# Patient Record
Sex: Male | Born: 1940 | Race: Black or African American | Hispanic: No | Smoking: Never smoker
Health system: Southern US, Community
[De-identification: ages and names within clinical notes are randomized; demographics above are authoritative.]

## PROBLEM LIST (undated history)

## (undated) DIAGNOSIS — I10 Essential (primary) hypertension: Secondary | ICD-10-CM

## (undated) DIAGNOSIS — C61 Malignant neoplasm of prostate: Secondary | ICD-10-CM

## (undated) DIAGNOSIS — F0280 Dementia in other diseases classified elsewhere without behavioral disturbance: Secondary | ICD-10-CM

## (undated) DIAGNOSIS — I495 Sick sinus syndrome: Secondary | ICD-10-CM

## (undated) DIAGNOSIS — I4891 Unspecified atrial fibrillation: Secondary | ICD-10-CM

## (undated) DIAGNOSIS — I351 Nonrheumatic aortic (valve) insufficiency: Secondary | ICD-10-CM

## (undated) DIAGNOSIS — K220 Achalasia of cardia: Secondary | ICD-10-CM

## (undated) DIAGNOSIS — N289 Disorder of kidney and ureter, unspecified: Secondary | ICD-10-CM

## (undated) HISTORY — PX: PACEMAKER IMPLANT: EP1218

## (undated) HISTORY — DX: Essential (primary) hypertension: I10

## (undated) HISTORY — PX: CHOLECYSTECTOMY: SHX55

## (undated) HISTORY — DX: Unspecified atrial fibrillation: I48.91

## (undated) HISTORY — DX: Nonrheumatic aortic (valve) insufficiency: I35.1

## (undated) HISTORY — DX: Dementia in other diseases classified elsewhere, unspecified severity, without behavioral disturbance, psychotic disturbance, mood disturbance, and anxiety: F02.80

## (undated) HISTORY — DX: Achalasia of cardia: K22.0

## (undated) HISTORY — DX: Malignant neoplasm of prostate: C61

## (undated) HISTORY — DX: Sick sinus syndrome: I49.5

---

## 2009-09-03 HISTORY — PX: ESOPHAGOGASTRODUODENOSCOPY: SHX1529

## 2010-09-03 HISTORY — PX: HELLER MYOTOMY: SHX5259

## 2010-09-03 HISTORY — PX: OTHER SURGICAL HISTORY: SHX169

## 2014-09-03 HISTORY — PX: ESOPHAGOGASTRODUODENOSCOPY: SHX1529

## 2019-02-11 ENCOUNTER — Encounter: Payer: Self-pay | Admitting: Internal Medicine

## 2019-04-01 ENCOUNTER — Ambulatory Visit: Payer: Self-pay | Admitting: Gastroenterology

## 2019-04-03 ENCOUNTER — Encounter: Payer: Self-pay | Admitting: *Deleted

## 2019-04-03 ENCOUNTER — Encounter: Payer: Self-pay | Admitting: Gastroenterology

## 2019-04-03 ENCOUNTER — Other Ambulatory Visit: Payer: Self-pay

## 2019-04-03 ENCOUNTER — Ambulatory Visit (INDEPENDENT_AMBULATORY_CARE_PROVIDER_SITE_OTHER): Payer: Medicare HMO | Admitting: Gastroenterology

## 2019-04-03 VITALS — BP 150/95 | HR 87 | Temp 96.2°F | Ht 74.0 in | Wt 177.6 lb

## 2019-04-03 DIAGNOSIS — N189 Chronic kidney disease, unspecified: Secondary | ICD-10-CM

## 2019-04-03 DIAGNOSIS — D631 Anemia in chronic kidney disease: Secondary | ICD-10-CM | POA: Diagnosis not present

## 2019-04-03 DIAGNOSIS — Z87448 Personal history of other diseases of urinary system: Secondary | ICD-10-CM

## 2019-04-03 DIAGNOSIS — K220 Achalasia of cardia: Secondary | ICD-10-CM | POA: Insufficient documentation

## 2019-04-03 NOTE — Patient Instructions (Signed)
We have ordered a swallow test for you so we can evaluate your anatomy.  I have ordered blood work today as well.  We are referring you to Nephrology.  Please follow a soft diet, sit upright while eating, and do not lay down for at least 3 hours after eating.   Further recommendations to follow!  It was a pleasure to see you today. I strive to create trusting relationships with patients to provide genuine, compassionate, and quality care. I value your feedback. If you receive a survey regarding your visit,  I greatly appreciate you taking time to fill this out.   Annitta Needs, PhD, ANP-BC [facility]

## 2019-04-03 NOTE — Progress Notes (Signed)
Primary Care Physician:  Monico Blitz, MD Primary Gastroenterologist:  Dr. Gala Romney   Chief Complaint  Patient presents with  . Dysphagia    HPI:   Curtis Mcintosh is a 78 y.o. male presenting today at the request of Dr. Manuella Mcintosh due to dysphagia. Curtis Mcintosh, daughter, is present. Patient resides with daughter. He has a history of achalasia, s/p heller myotomy with fundoplication at the [facility] in 2012. Last EGD in 2016 at the [facility] with fluid-filled dilated esophagus tight at EG junction, s/p dilation. He noted improvement after that time.   Hard to swallow with eating, drinking water. Gets to throat/mid-esophagus and can't go down. Will regurgitate food as well. Eating a soft diet. Daughter, Curtis Mcintosh, present. Bread will get hung in esophagus. Mashed potatoes go down well. Has to pound chest to get it down. Liquids tolerated. No odynophagia. Feels like the door in esophagus is shut and can't open it. Weight fluctuates. In 2012 was a "big man". Has stayed in the 170s for some time. Daughter has bought boost drinks for patient, carnation instant breakfast. Had been on omeprazole but daughter states nephrologist discontinued PPI in setting of stage 3 CKD at the end of 2019. Pepcid 40 mg BID.   In past has done well with more solid food items such as beef, chicken, sausage. For past year with recurrent dysphagia.   Daughter states history of anemia. Cold all the time. No recent labs.   Past Medical History:  Diagnosis Date  . A-fib ([place])   . Achalasia   . Alzheimer's dementia ([place] )   . Aortic insufficiency   . HTN (hypertension)   . Prostate cancer ([place])   . Sick sinus syndrome [facility])    pacemaker in remote past    Past Surgical History:  Procedure Laterality Date  . CHOLECYSTECTOMY    . ESOPHAGOGASTRODUODENOSCOPY  2011   [facility]: dilated esophagus and tight LES, s/p 25 units Botox, multiple antral polyps s/p biopsy  . ESOPHAGOGASTRODUODENOSCOPY  2016   Last EGD in 2016 at  the [facility] with fluid-filled esophagus with dilatation, s/p dilation. Tight at EG junction. polypoid gastritis in antrum  . HELLER MYOTOMY  2012   [facility]  . PACEMAKER IMPLANT    . pH and manometry  2012   motility compatible with achalasia or complete aperistalsis, or compatible with s/p Heller myotomy, acidity within esophagus.     Current Outpatient Medications  Medication Sig Dispense Refill  . albuterol (PROVENTIL) (2.5 MG/3ML) 0.083% nebulizer solution Inhale 3 mLs into the lungs as needed.    . calcitRIOL (ROCALTROL) 0.5 MCG capsule Take 1 capsule by mouth daily.    Marland Kitchen donepezil (ARICEPT) 5 MG tablet Take 5 mg by mouth at bedtime.    . famotidine (PEPCID) 40 MG tablet Take 1 tablet by mouth 2 (two) times a day.    . lisinopril (ZESTRIL) 20 MG tablet Take 1 tablet by mouth daily.    . meloxicam (MOBIC) 7.5 MG tablet Take 1 tablet by mouth 2 (two) times a day.    . mirtazapine (REMERON) 15 MG tablet Take 1 tablet by mouth at bedtime.    . tamsulosin (FLOMAX) 0.4 MG CAPS capsule Take 1 capsule by mouth daily.     No current facility-administered medications for this visit.     Allergies as of 04/03/2019  . (No Known Allergies)    Family History  Problem Relation Age of Onset  . Colon cancer Neg Hx   .  Colon polyps Neg Hx     Social History   Socioeconomic History  . Marital status: Divorced    Spouse name: Not on file  . Number of children: Not on file  . Years of education: Not on file  . Highest education level: Not on file  Occupational History  . Not on file  Social Needs  . Financial resource strain: Not on file  . Food insecurity    Worry: Not on file    Inability: Not on file  . Transportation needs    Medical: Not on file    Non-medical: Not on file  Tobacco Use  . Smoking status: Never Smoker  . Smokeless tobacco: Never Used  Substance and Sexual Activity  . Alcohol use: Not Currently    Comment: in the past  . Drug use: Never  .  Sexual activity: Not on file  Lifestyle  . Physical activity    Days per week: Not on file    Minutes per session: Not on file  . Stress: Not on file  Relationships  . Social Herbalist on phone: Not on file    Gets together: Not on file    Attends religious service: Not on file    Active member of club or organization: Not on file    Attends meetings of clubs or organizations: Not on file    Relationship status: Not on file  . Intimate partner violence    Fear of current or ex partner: Not on file    Emotionally abused: Not on file    Physically abused: Not on file    Forced sexual activity: Not on file  Other Topics Concern  . Not on file  Social History Narrative  . Not on file    Review of Systems: Gen: see HPI CV: Denies chest pain, heart palpitations, peripheral edema, syncope.  Resp: Denies shortness of breath at rest or with exertion. Denies wheezing or cough.  GI: see HPI GU : Denies urinary burning, urinary frequency, urinary hesitancy MS: Denies joint pain, muscle weakness, cramps, or limitation of movement.  Derm: Denies rash, itching, dry skin Psych: +confusion, history of dementia Heme: Denies bruising, bleeding, and enlarged lymph nodes.  Physical Exam: BP (!) 150/95   Pulse 87   Temp (!) 96.2 F (35.7 C) (Temporal)   Ht 6\' 2"  (1.88 m)   Wt 177 lb 9.6 oz (80.6 kg)   BMI 22.80 kg/m  General:   Alert and oriented. Pleasant and cooperative. Well-nourished and well-developed.  Head:  Normocephalic and atraumatic. Eyes:  Without icterus, sclera clear and conjunctiva pink.  Ears:  Normal auditory acuity. Nose:  No deformity, discharge,  or lesions. Lungs:  Clear to auscultation bilaterally. No wheezes, rales, or rhonchi. No distress.  Heart:  S1, S2 present with irregular. Possibly murmur Abdomen:  +BS, soft, non-tender and non-distended. No HSM noted. No guarding or rebound. No masses appreciated.  Rectal:  Deferred  Extremities:  Without   edema. Neurologic:  Alert and  oriented x4 but limited historian in setting of Alzheimer's dementia  Psych:  Alert and cooperative. Normal mood and affect.

## 2019-04-07 ENCOUNTER — Encounter: Payer: Self-pay | Admitting: Gastroenterology

## 2019-04-07 NOTE — Assessment & Plan Note (Signed)
Per daughter, patient with history of anemia. History of CKD and requesting referral to Nephrology. Will refer to Nephrology and update CBC and iron studies.

## 2019-04-07 NOTE — Assessment & Plan Note (Signed)
78 year old male with history of achalasia, s/p Heller myotomy with fundoplication in 5488 at the [facility]. Last EGD in 2016 in [place] with fluid-filled dilated esophagus, tight at EG junction, s/p dilation, and he noted improvement after this with dysphagia. Now with recurrent solid food dysphagia, regurgitation for the past year. Will pursue UGI and then likely need endoscopic intervention. Further recommendations to follow.

## 2019-04-09 ENCOUNTER — Telehealth: Payer: Self-pay | Admitting: Internal Medicine

## 2019-04-09 ENCOUNTER — Ambulatory Visit (HOSPITAL_COMMUNITY): Payer: Medicare HMO

## 2019-04-09 NOTE — Telephone Encounter (Signed)
Patient daughter called and said they would not be able to get to his appointment today in radiology, I gave her the central scheduling number to get that rescheduled.

## 2019-04-09 NOTE — Progress Notes (Signed)
Cc'd to pcp 

## 2019-04-09 NOTE — Telephone Encounter (Signed)
noted 

## 2019-04-15 ENCOUNTER — Other Ambulatory Visit: Payer: Self-pay

## 2019-04-15 ENCOUNTER — Encounter (HOSPITAL_COMMUNITY): Payer: Self-pay

## 2019-04-15 ENCOUNTER — Telehealth: Payer: Self-pay | Admitting: Gastroenterology

## 2019-04-15 ENCOUNTER — Inpatient Hospital Stay (HOSPITAL_COMMUNITY)
Admission: EM | Admit: 2019-04-15 | Discharge: 2019-04-19 | DRG: 378 | Disposition: A | Discharge status: Home / Self Care | Payer: Medicare HMO | Source: Ambulatory Visit | Attending: Family Medicine | Admitting: Family Medicine

## 2019-04-15 ENCOUNTER — Other Ambulatory Visit (HOSPITAL_COMMUNITY)
Admission: RE | Admit: 2019-04-15 | Discharge: 2019-04-15 | Disposition: A | Discharge status: Home / Self Care | Payer: Medicare HMO | Source: Ambulatory Visit | Attending: Gastroenterology | Admitting: Gastroenterology

## 2019-04-15 ENCOUNTER — Telehealth: Payer: Self-pay

## 2019-04-15 ENCOUNTER — Ambulatory Visit (HOSPITAL_COMMUNITY)
Admission: RE | Admit: 2019-04-15 | Discharge: 2019-04-15 | Disposition: A | Discharge status: Home / Self Care | Payer: Medicare HMO | Source: Ambulatory Visit | Attending: Gastroenterology | Admitting: Gastroenterology

## 2019-04-15 DIAGNOSIS — D631 Anemia in chronic kidney disease: Secondary | ICD-10-CM | POA: Diagnosis present

## 2019-04-15 DIAGNOSIS — R1310 Dysphagia, unspecified: Secondary | ICD-10-CM

## 2019-04-15 DIAGNOSIS — K220 Achalasia of cardia: Secondary | ICD-10-CM | POA: Diagnosis present

## 2019-04-15 DIAGNOSIS — R109 Unspecified abdominal pain: Secondary | ICD-10-CM

## 2019-04-15 DIAGNOSIS — T39395A Adverse effect of other nonsteroidal anti-inflammatory drugs [NSAID], initial encounter: Secondary | ICD-10-CM | POA: Diagnosis present

## 2019-04-15 DIAGNOSIS — Z8546 Personal history of malignant neoplasm of prostate: Secondary | ICD-10-CM

## 2019-04-15 DIAGNOSIS — Z20828 Contact with and (suspected) exposure to other viral communicable diseases: Secondary | ICD-10-CM | POA: Diagnosis present

## 2019-04-15 DIAGNOSIS — R1314 Dysphagia, pharyngoesophageal phase: Secondary | ICD-10-CM | POA: Diagnosis present

## 2019-04-15 DIAGNOSIS — I10 Essential (primary) hypertension: Secondary | ICD-10-CM

## 2019-04-15 DIAGNOSIS — K922 Gastrointestinal hemorrhage, unspecified: Secondary | ICD-10-CM | POA: Diagnosis not present

## 2019-04-15 DIAGNOSIS — F0390 Unspecified dementia without behavioral disturbance: Secondary | ICD-10-CM

## 2019-04-15 DIAGNOSIS — I4891 Unspecified atrial fibrillation: Secondary | ICD-10-CM | POA: Diagnosis present

## 2019-04-15 DIAGNOSIS — D649 Anemia, unspecified: Secondary | ICD-10-CM | POA: Diagnosis not present

## 2019-04-15 DIAGNOSIS — N179 Acute kidney failure, unspecified: Secondary | ICD-10-CM | POA: Diagnosis present

## 2019-04-15 DIAGNOSIS — K921 Melena: Principal | ICD-10-CM | POA: Diagnosis present

## 2019-04-15 DIAGNOSIS — R933 Abnormal findings on diagnostic imaging of other parts of digestive tract: Secondary | ICD-10-CM

## 2019-04-15 DIAGNOSIS — Z791 Long term (current) use of non-steroidal anti-inflammatories (NSAID): Secondary | ICD-10-CM

## 2019-04-15 DIAGNOSIS — G309 Alzheimer's disease, unspecified: Secondary | ICD-10-CM | POA: Diagnosis present

## 2019-04-15 DIAGNOSIS — D500 Iron deficiency anemia secondary to blood loss (chronic): Secondary | ICD-10-CM

## 2019-04-15 DIAGNOSIS — D62 Acute posthemorrhagic anemia: Secondary | ICD-10-CM | POA: Diagnosis present

## 2019-04-15 DIAGNOSIS — Z9049 Acquired absence of other specified parts of digestive tract: Secondary | ICD-10-CM

## 2019-04-15 DIAGNOSIS — K228 Other specified diseases of esophagus: Secondary | ICD-10-CM | POA: Diagnosis present

## 2019-04-15 DIAGNOSIS — E861 Hypovolemia: Secondary | ICD-10-CM | POA: Diagnosis present

## 2019-04-15 DIAGNOSIS — K317 Polyp of stomach and duodenum: Secondary | ICD-10-CM | POA: Diagnosis present

## 2019-04-15 DIAGNOSIS — R1319 Other dysphagia: Secondary | ICD-10-CM

## 2019-04-15 DIAGNOSIS — I129 Hypertensive chronic kidney disease with stage 1 through stage 4 chronic kidney disease, or unspecified chronic kidney disease: Secondary | ICD-10-CM | POA: Diagnosis present

## 2019-04-15 DIAGNOSIS — I495 Sick sinus syndrome: Secondary | ICD-10-CM | POA: Diagnosis present

## 2019-04-15 DIAGNOSIS — I351 Nonrheumatic aortic (valve) insufficiency: Secondary | ICD-10-CM | POA: Diagnosis present

## 2019-04-15 DIAGNOSIS — R296 Repeated falls: Secondary | ICD-10-CM | POA: Diagnosis present

## 2019-04-15 DIAGNOSIS — Y929 Unspecified place or not applicable: Secondary | ICD-10-CM

## 2019-04-15 DIAGNOSIS — Z95810 Presence of automatic (implantable) cardiac defibrillator: Secondary | ICD-10-CM

## 2019-04-15 DIAGNOSIS — N184 Chronic kidney disease, stage 4 (severe): Secondary | ICD-10-CM | POA: Diagnosis present

## 2019-04-15 DIAGNOSIS — F0280 Dementia in other diseases classified elsewhere without behavioral disturbance: Secondary | ICD-10-CM | POA: Diagnosis present

## 2019-04-15 DIAGNOSIS — Z79899 Other long term (current) drug therapy: Secondary | ICD-10-CM

## 2019-04-15 HISTORY — DX: Disorder of kidney and ureter, unspecified: N28.9

## 2019-04-15 LAB — CBC WITH DIFFERENTIAL/PLATELET
Abs Immature Granulocytes: 0 10*3/uL (ref 0.00–0.07)
Basophils Absolute: 0 10*3/uL (ref 0.0–0.1)
Basophils Relative: 0 %
Eosinophils Absolute: 0.2 10*3/uL (ref 0.0–0.5)
Eosinophils Relative: 6 %
HCT: 24.5 % — ABNORMAL LOW (ref 39.0–52.0)
Hemoglobin: 6.9 g/dL — CL (ref 13.0–17.0)
Immature Granulocytes: 0 %
Lymphocytes Relative: 23 %
Lymphs Abs: 0.8 10*3/uL (ref 0.7–4.0)
MCH: 20.4 pg — ABNORMAL LOW (ref 26.0–34.0)
MCHC: 28.2 g/dL — ABNORMAL LOW (ref 30.0–36.0)
MCV: 72.5 fL — ABNORMAL LOW (ref 80.0–100.0)
Monocytes Absolute: 0.4 10*3/uL (ref 0.1–1.0)
Monocytes Relative: 10 %
Neutro Abs: 2.2 10*3/uL (ref 1.7–7.7)
Neutrophils Relative %: 61 %
Platelets: 109 10*3/uL — ABNORMAL LOW (ref 150–400)
RBC: 3.38 MIL/uL — ABNORMAL LOW (ref 4.22–5.81)
RDW: 20.1 % — ABNORMAL HIGH (ref 11.5–15.5)
WBC: 3.6 10*3/uL — ABNORMAL LOW (ref 4.0–10.5)
nRBC: 0 % (ref 0.0–0.2)

## 2019-04-15 LAB — IRON AND TIBC
Iron: 22 ug/dL — ABNORMAL LOW (ref 45–182)
Iron: 25 ug/dL — ABNORMAL LOW (ref 45–182)
Saturation Ratios: 6 % — ABNORMAL LOW (ref 17.9–39.5)
Saturation Ratios: 6 % — ABNORMAL LOW (ref 17.9–39.5)
TIBC: 382 ug/dL (ref 250–450)
TIBC: 402 ug/dL (ref 250–450)
UIBC: 360 ug/dL
UIBC: 377 ug/dL

## 2019-04-15 LAB — COMPREHENSIVE METABOLIC PANEL
ALT: 11 U/L (ref 0–44)
AST: 11 U/L — ABNORMAL LOW (ref 15–41)
Albumin: 3.4 g/dL — ABNORMAL LOW (ref 3.5–5.0)
Alkaline Phosphatase: 41 U/L (ref 38–126)
Anion gap: 5 (ref 5–15)
BUN: 22 mg/dL (ref 8–23)
CO2: 28 mmol/L (ref 22–32)
Calcium: 8.7 mg/dL — ABNORMAL LOW (ref 8.9–10.3)
Chloride: 111 mmol/L (ref 98–111)
Creatinine, Ser: 2.38 mg/dL — ABNORMAL HIGH (ref 0.61–1.24)
GFR calc Af Amer: 29 mL/min — ABNORMAL LOW (ref >60)
GFR calc non Af Amer: 25 mL/min — ABNORMAL LOW (ref >60)
Glucose, Bld: 105 mg/dL — ABNORMAL HIGH (ref 70–99)
Potassium: 3.6 mmol/L (ref 3.5–5.1)
Sodium: 144 mmol/L (ref 135–145)
Total Bilirubin: 0.4 mg/dL (ref 0.3–1.2)
Total Protein: 5.7 g/dL — ABNORMAL LOW (ref 6.5–8.1)

## 2019-04-15 LAB — COMPREHENSIVE METABOLIC PANEL WITH GFR
ALT: 11 U/L (ref 0–44)
AST: 12 U/L — ABNORMAL LOW (ref 15–41)
Albumin: 3.3 g/dL — ABNORMAL LOW (ref 3.5–5.0)
Alkaline Phosphatase: 40 U/L (ref 38–126)
Anion gap: 7 (ref 5–15)
BUN: 22 mg/dL (ref 8–23)
CO2: 29 mmol/L (ref 22–32)
Calcium: 8.8 mg/dL — ABNORMAL LOW (ref 8.9–10.3)
Chloride: 109 mmol/L (ref 98–111)
Creatinine, Ser: 2.36 mg/dL — ABNORMAL HIGH (ref 0.61–1.24)
GFR calc Af Amer: 29 mL/min — ABNORMAL LOW
GFR calc non Af Amer: 25 mL/min — ABNORMAL LOW
Glucose, Bld: 95 mg/dL (ref 70–99)
Potassium: 4 mmol/L (ref 3.5–5.1)
Sodium: 145 mmol/L (ref 135–145)
Total Bilirubin: 0.4 mg/dL (ref 0.3–1.2)
Total Protein: 5.6 g/dL — ABNORMAL LOW (ref 6.5–8.1)

## 2019-04-15 LAB — RETICULOCYTES
Immature Retic Fract: 15.8 % (ref 2.3–15.9)
RBC.: 3.42 MIL/uL — ABNORMAL LOW (ref 4.22–5.81)
Retic Count, Absolute: 38 10*3/uL (ref 19.0–186.0)
Retic Ct Pct: 1.1 % (ref 0.4–3.1)

## 2019-04-15 LAB — CBC
HCT: 24.5 % — ABNORMAL LOW (ref 39.0–52.0)
Hemoglobin: 6.9 g/dL — CL (ref 13.0–17.0)
MCH: 20.4 pg — ABNORMAL LOW (ref 26.0–34.0)
MCHC: 28.2 g/dL — ABNORMAL LOW (ref 30.0–36.0)
MCV: 72.5 fL — ABNORMAL LOW (ref 80.0–100.0)
Platelets: 107 10*3/uL — ABNORMAL LOW (ref 150–400)
RBC: 3.38 MIL/uL — ABNORMAL LOW (ref 4.22–5.81)
RDW: 20.1 % — ABNORMAL HIGH (ref 11.5–15.5)
WBC: 4.4 10*3/uL (ref 4.0–10.5)
nRBC: 0 % (ref 0.0–0.2)

## 2019-04-15 LAB — FOLATE: Folate: 9.9 ng/mL (ref >5.9)

## 2019-04-15 LAB — FERRITIN
Ferritin: 4 ng/mL — ABNORMAL LOW (ref 24–336)
Ferritin: 4 ng/mL — ABNORMAL LOW (ref 24–336)

## 2019-04-15 LAB — PREPARE RBC (CROSSMATCH)

## 2019-04-15 LAB — POC OCCULT BLOOD, ED: Fecal Occult Bld: POSITIVE — AB

## 2019-04-15 LAB — SARS CORONAVIRUS 2 BY RT PCR (HOSPITAL ORDER, PERFORMED IN ~~LOC~~ HOSPITAL LAB): SARS Coronavirus 2: NEGATIVE

## 2019-04-15 LAB — VITAMIN B12: Vitamin B-12: 210 pg/mL (ref 180–914)

## 2019-04-15 LAB — ABO/RH: ABO/RH(D): A POS

## 2019-04-15 MED ORDER — ALBUTEROL SULFATE (2.5 MG/3ML) 0.083% IN NEBU: 3.0000 mL | INHALATION_SOLUTION | Freq: Four times a day (QID) | RESPIRATORY_TRACT | Status: DC | PRN

## 2019-04-15 MED ORDER — CHOLECALCIFEROL 10 MCG (400 UNIT) PO TABS
800.0000 [IU] | ORAL_TABLET | Freq: Every morning | ORAL | Status: DC
  Administered 2019-04-16 – 2019-04-19 (×4): 800 [IU] via ORAL
  Filled 2019-04-15 (×4): qty 2

## 2019-04-15 MED ORDER — FERROUS SULFATE 325 (65 FE) MG PO TABS
325.0000 mg | ORAL_TABLET | Freq: Three times a day (TID) | ORAL | Status: DC
  Administered 2019-04-16 – 2019-04-17 (×5): 325 mg via ORAL
  Filled 2019-04-15 (×5): qty 1

## 2019-04-15 MED ORDER — ACETAMINOPHEN 650 MG RE SUPP: 650.0000 mg | Freq: Four times a day (QID) | RECTAL | Status: DC | PRN

## 2019-04-15 MED ORDER — FAMOTIDINE 20 MG PO TABS
40.0000 mg | ORAL_TABLET | Freq: Every day | ORAL | Status: DC
  Filled 2019-04-15: qty 2

## 2019-04-15 MED ORDER — ACETAMINOPHEN 325 MG PO TABS
650.0000 mg | ORAL_TABLET | Freq: Four times a day (QID) | ORAL | Status: DC | PRN
  Administered 2019-04-16 – 2019-04-18 (×2): 650 mg via ORAL
  Filled 2019-04-15 (×2): qty 2

## 2019-04-15 MED ORDER — PANTOPRAZOLE SODIUM 40 MG IV SOLR
40.0000 mg | Freq: Once | INTRAVENOUS | Status: AC
  Administered 2019-04-15: 40 mg via INTRAVENOUS
  Filled 2019-04-15: qty 40

## 2019-04-15 MED ORDER — HYDRALAZINE HCL 20 MG/ML IJ SOLN
10.0000 mg | INTRAMUSCULAR | Status: DC | PRN
  Administered 2019-04-15 (×2): 10 mg via INTRAVENOUS
  Filled 2019-04-15 (×2): qty 1

## 2019-04-15 MED ORDER — MEMANTINE HCL-DONEPEZIL HCL ER 7-10 MG PO CP24
1.0000 | ORAL_CAPSULE | Freq: Every morning | ORAL | Status: DC
  Filled 2019-04-15 (×2): qty 1

## 2019-04-15 MED ORDER — ONDANSETRON HCL 4 MG PO TABS: 4.0000 mg | ORAL_TABLET | Freq: Four times a day (QID) | ORAL | Status: DC | PRN

## 2019-04-15 MED ORDER — SODIUM CHLORIDE 0.9% IV SOLUTION
Freq: Once | INTRAVENOUS | Status: AC
  Administered 2019-04-15: 17:00:00 via INTRAVENOUS

## 2019-04-15 MED ORDER — CALCITRIOL 0.25 MCG PO CAPS
0.5000 ug | ORAL_CAPSULE | Freq: Every morning | ORAL | Status: DC
  Administered 2019-04-16 – 2019-04-19 (×4): 0.5 ug via ORAL
  Filled 2019-04-15: qty 1
  Filled 2019-04-15: qty 2
  Filled 2019-04-15: qty 1
  Filled 2019-04-15 (×3): qty 2

## 2019-04-15 MED ORDER — MIRTAZAPINE 15 MG PO TABS
15.0000 mg | ORAL_TABLET | Freq: Every day | ORAL | Status: DC
  Administered 2019-04-15 – 2019-04-18 (×4): 15 mg via ORAL
  Filled 2019-04-15 (×4): qty 1

## 2019-04-15 MED ORDER — ONDANSETRON HCL 4 MG/2ML IJ SOLN: 4.0000 mg | Freq: Four times a day (QID) | INTRAMUSCULAR | Status: DC | PRN

## 2019-04-15 MED ORDER — TAMSULOSIN HCL 0.4 MG PO CAPS
0.4000 mg | ORAL_CAPSULE | Freq: Every morning | ORAL | Status: DC
  Administered 2019-04-16 – 2019-04-19 (×4): 0.4 mg via ORAL
  Filled 2019-04-15 (×4): qty 1

## 2019-04-15 MED ORDER — CYANOCOBALAMIN 1000 MCG/ML IJ SOLN: 1000.0000 ug | Freq: Once | INTRAMUSCULAR | Status: DC

## 2019-04-15 NOTE — Telephone Encounter (Signed)
Opened in error

## 2019-04-15 NOTE — Telephone Encounter (Signed)
I spoke with his daughter, [name] and she is aware.  She said he does have black tarry stool and has that a lot.  As far as weakness and tiredness, she said he sleeps all of the time.   I told her that Vicente Males was recommending if he had any of those symptoms to go to the ED because of all of his problems.   She said she will take him to [facility] ED.

## 2019-04-15 NOTE — ED Triage Notes (Signed)
Pt was seen at short stay today and was called due to low hemoglobin. Reports black tarry stool for several weeks

## 2019-04-15 NOTE — ED Notes (Signed)
Date and time results received: 04/15/19 1:52 PM  (use smartphrase ".now" to insert current time)  Test: hemoglobin Critical Value: 6.9  Name of Provider Notified: Roderic Palau, MD  Orders Received? Or Actions Taken?: na

## 2019-04-15 NOTE — Telephone Encounter (Signed)
Agree/Noted. 

## 2019-04-15 NOTE — Telephone Encounter (Signed)
Spoke with pt's daughter. The only symptom that was listed by AB was the dark tarry stool. Pt was advised to go to the ED as directed by AB.

## 2019-04-15 NOTE — H&P (Signed)
History and Physical    Curtis Mcintosh JQB:[id number] DOB: [birth year] DOA: 04/15/2019  I have briefly reviewed the patient's prior medical records in [place]  PCP: Monico Blitz, MD  Patient coming from: Home  Chief Complaint: Low hemoglobin  HPI: Curtis Mcintosh is a 78 y.o. male with medical history significant of dementia, hypertension, prostate cancer, chronic kidney disease, sick sinus syndrome status post pacemaker, history of A. fib, comes to the hospital with complaints of low hemoglobin level.  He was evaluated in the GI office for achalasia, and blood work showed a hemoglobin of 6.9 and was sent to the ED.  Patient has underlying dementia and is a poor historian but he can tell me that over the last couple of weeks he has been having black stools.  He denies Goody's powder, denies NSAIDs or history of GI bleed.  He denies any nausea or vomiting.  He denies any abdominal pain, denies any diarrhea.  He denies any fever or chills.  He is also telling me that his food is been getting stuck in his chest for a long time but is not sure how long  ED Course: In the emergency room he is afebrile, blood pressure is on the high side in the 409 systolic, he is satting well on room air.  Blood work reveals a creatinine of 2.38, BUN 22, low iron and ferritin levels.  B12 is 210.  CBC shows a hemoglobin of 6.9 and MCV of 72.5.  His platelets are 107.  Review of Systems: As per HPI otherwise 10 point review of systems negative.   Past Medical History:  Diagnosis Date  . A-fib ([place])   . Achalasia   . Alzheimer's dementia ([place])   . Aortic insufficiency   . HTN (hypertension)   . Prostate cancer [facility])    prostate  . Renal disorder    stage 3 kidney failure  . Sick sinus syndrome [facility])    pacemaker in remote past    Past Surgical History:  Procedure Laterality Date  . CHOLECYSTECTOMY    . ESOPHAGOGASTRODUODENOSCOPY  2011   [facility]: dilated esophagus and tight LES, s/p 25 units Botox, multiple  antral polyps s/p biopsy  . ESOPHAGOGASTRODUODENOSCOPY  2016   Last EGD in 2016 at the [facility] with fluid-filled esophagus with dilatation, s/p dilation. Tight at EG junction. polypoid gastritis in antrum  . HELLER MYOTOMY  2012   [facility]  . PACEMAKER IMPLANT    . pH and manometry  2012   motility compatible with achalasia or complete aperistalsis, or compatible with s/p Heller myotomy, acidity within esophagus.      reports that he has never smoked. He has never used smokeless tobacco. He reports previous alcohol use. He reports that he does not use drugs.  No Known Allergies  Family History  Problem Relation Age of Onset  . Colon cancer Neg Hx   . Colon polyps Neg Hx     Prior to Admission medications   Medication Sig Start Date End Date Taking? Authorizing Provider  albuterol (PROVENTIL) (2.5 MG/3ML) 0.083% nebulizer solution Inhale 3 mLs into the lungs every 6 (six) hours as needed for wheezing or shortness of breath.  03/18/19  Yes [provider]  calcitRIOL (ROCALTROL) 0.5 MCG capsule Take 0.5 mcg by mouth every morning.  09/09/18  Yes [provider]  famotidine (PEPCID) 40 MG tablet Take 40 mg by mouth 2 (two) times a day.    Yes [provider]  lisinopril (ZESTRIL) 20 MG tablet Take 20 mg by mouth every morning.  03/18/19  Yes [provider]  meloxicam (MOBIC) 7.5 MG tablet Take 7.5 mg by mouth daily as needed for pain (Take with meals as needed for pain).  03/25/19  Yes [provider]  Memantine HCl-Donepezil HCl (NAMZARIC) 7-10 MG CP24 Take 1 capsule by mouth every morning.   Yes [provider]  mirtazapine (REMERON) 15 MG tablet Take 15 mg by mouth at bedtime.  03/25/19  Yes [provider]  tamsulosin (FLOMAX) 0.4 MG CAPS capsule Take 0.4 mg by mouth every morning.  09/09/18  Yes [provider]  Vitamin D, Cholecalciferol, 10 MCG (400 UNIT) TABS Take 2 tablets by mouth every morning.   Yes  [provider]    Physical Exam: Vitals:   04/15/19 1632 04/15/19 1646 04/15/19 1647 04/15/19 1649  BP: (!) 213/109 (!) 194/107 (!) 194/107   Pulse: 68 66 63   Resp: 16 19 16    Temp: 97.7 F (36.5 C)  97.6 F (36.4 C) 97.6 F (36.4 C)  TempSrc: Oral  Oral Oral  SpO2:  99%    Weight:      Height:        Constitutional: NAD, calm, comfortable Eyes: PERRL, lids and conjunctivae normal ENMT: Mucous membranes are moist.  Neck: normal, supple Respiratory: clear to auscultation bilaterally, no wheezing, no crackles. Normal respiratory effort. No accessory muscle use.  Cardiovascular: Regular rate and rhythm, no murmurs / rubs / gallops. No extremity edema. 2+ pedal pulses.  Abdomen: no tenderness, no masses palpated. Bowel sounds positive.  Musculoskeletal: no clubbing / cyanosis. Normal muscle tone.  Skin: no rashes, lesions, ulcers. No induration Neurologic: CN 2-12 grossly intact. Strength 5/5 in all 4.   Labs on Admission: I have personally reviewed following labs and imaging studies  CBC: Recent Labs  Lab 04/15/19 0907 04/15/19 1341  WBC 3.6* 4.4  NEUTROABS 2.2  --   HGB 6.9* 6.9*  HCT 24.5* 24.5*  MCV 72.5* 72.5*  PLT 109* 321*   Basic Metabolic Panel: Recent Labs  Lab 04/15/19 0907 04/15/19 1341  NA 145 144  K 4.0 3.6  CL 109 111  CO2 29 28  GLUCOSE 95 105*  BUN 22 22  CREATININE 2.36* 2.38*  CALCIUM 8.8* 8.7*   GFR: Estimated Creatinine Clearance: 28.7 mL/min (A) (by C-G formula based on SCr of 2.38 mg/dL (H)). Liver Function Tests: Recent Labs  Lab 04/15/19 0907 04/15/19 1341  AST 12* 11*  ALT 11 11  ALKPHOS 40 41  BILITOT 0.4 0.4  PROT 5.6* 5.7*  ALBUMIN 3.3* 3.4*   No results for input(s): LIPASE, AMYLASE in the last 168 hours. No results for input(s): AMMONIA in the last 168 hours. Coagulation Profile: No results for input(s): INR, PROTIME in the last 168 hours. Cardiac Enzymes: No results for input(s): CKTOTAL, CKMB,  CKMBINDEX, TROPONINI in the last 168 hours. BNP (last 3 results) No results for input(s): PROBNP in the last 8760 hours. HbA1C: No results for input(s): HGBA1C in the last 72 hours. CBG: No results for input(s): GLUCAP in the last 168 hours. Lipid Profile: No results for input(s): CHOL, HDL, LDLCALC, TRIG, CHOLHDL, LDLDIRECT in the last 72 hours. Thyroid Function Tests: No results for input(s): TSH, T4TOTAL, FREET4, T3FREE, THYROIDAB in the last 72 hours. Anemia Panel: Recent Labs    04/15/19 0908 04/15/19 1340 04/15/19 1341  VITAMINB12  --   --  210  FOLATE  --  9.9  --   FERRITIN 4*  --  4*  TIBC 382  --  402  IRON 22*  --  25*  RETICCTPCT  --  1.1  --    Urine analysis: No results found for: COLORURINE, APPEARANCEUR, LABSPEC, PHURINE, GLUCOSEU, HGBUR, BILIRUBINUR, KETONESUR, PROTEINUR, UROBILINOGEN, NITRITE, LEUKOCYTESUR   Radiological Exams on Admission: Dg Ugi W Double Cm (hd Ba)  Result Date: 04/15/2019 CLINICAL DATA:  Achalasia, dysphagia. EXAM: UPPER GI SERIES WITH KUB TECHNIQUE: After obtaining a scout radiograph a routine upper GI series was performed using thin and high density barium. FLUOROSCOPY TIME:  Radiation Exposure Index (if provided by the fluoroscopic device): 331.1 mGy. COMPARISON:  None. FINDINGS: Severe narrowing of the gastroesophageal junction is noted consistent with a history of achalasia. There is dilatation of the esophagus, particularly involving its distal portion. No definite mass is noted. However, there does appear to be some retained food debris within the distal esophagus. A large amount of the administered barium remained within the esophagus throughout the exam. Given the fact that there was a significant amount of retained barium within the distal esophagus, as well as the presence of the achalasia and food debris, barium tablet was not administered as it certainly would not have passed into the stomach. No definite hiatal hernia is noted.  Visualization of the stomach was limited due to the achalasia. Persistent rounded defect is seen involving the gastric cardia which may represent external compression, but possible mass cannot be excluded. Contrast flowed slowly from the stomach into the duodenum. The duodenal bulb and sweep are unremarkable. Status post cholecystectomy. IMPRESSION: Severe narrowing of the gastroesophageal junction is noted consistent with the given history of achalasia. There is significant dilatation of the esophagus with probable retained food debris seen in the distal stomach. Large amount of administered contrast remained within the esophagus throughout the exam, suggesting significant stenosis. Persistent rounded filling defect is seen involving the gastric cardia which may simply represent external compression, but possible mass cannot be excluded. Endoscopy is recommended for further evaluation. Electronically Signed   By: Marijo Conception M.D.   On: 04/15/2019 11:41    EKG: Independently reviewed.  Paced rhythm  Assessment/Plan Active Problems:   Melena   Principal Problem Acute blood loss anemia with melena -Concern for upper GI bleed, patient is on meloxicam but denies any other NSAIDs. -This also multifactorial component to his anemia as he clearly iron deficient and his B12 is borderline.  We will supplement both -GI has been consulted, make patient n.p.o. until evaluated by gastroenterology.  There was concern for achalasia in the outpatient evaluation. -He will be transfused a unit of packed red blood cells, repeat CBC in the morning  Active Problems Acute kidney injury on chronic kidney disease stage IV -Baseline creatinine in care everywhere ranges from 1.7-1.97 in the past couple years.  Currently at 2.38, suspect worsening anemia as potential culprit -Continue home medications for his kidney disease -Hold home lisinopril, hold meloxicam  Hypertension -Hold lisinopril, provide hydralazine as  needed  Dementia -Mild, continue home medications  History of atrial fibrillation, history of sick sinus syndrome status post pacemaker -Per report, he does not appear to be on anticoagulation, majority of his care is in [place] and will defer to outpatient MDs further management   DVT prophylaxis: SCDs Code Status: Full code Family Communication: Discussed with patient Disposition Plan: Admit to telemetry Consults called: Gastroenterology   Marzetta Board, MD, PhD [facility]  Contact via [email or web address]  [place] Office Info P: [phone] F: [phone]   04/15/2019, 5:31 PM

## 2019-04-15 NOTE — ED Notes (Signed)
[place], [place] notified of pt BP.

## 2019-04-15 NOTE — ED Provider Notes (Signed)
[facility] EMERGENCY DEPARTMENT Provider Note   CSN: [id number] Arrival date & time: 04/15/19  1217    History   Chief Complaint Chief Complaint  Patient presents with   Abnormal Lab    HPI Curtis Mcintosh is a 78 y.o. male with past medical history of atrial fibrillation, achalasia, Alzheimer's dementia, hypertension, prostate cancer, CKD, sick sinus rhythm, presenting to the emergency department with reported low hemoglobin level.  He was seen at [facility] to be evaluated for his achalasia.  His labs noted a low hemoglobin and he was instructed to come to the ED.  He states he has been having intermittent dark tarry stools over the last couple of weeks with increasing fatigue, weakness, sleepiness, and shortness of breath on exertion.  He denies abdominal pain, urinary symptoms, fevers, or other complaints.  He is not on anticoagulation.  He has a pacemaker in place for his A. fib.  He has history of anemia of chronic disease, however he has never required blood transfusion.     The history is provided by the patient and medical records.    Past Medical History:  Diagnosis Date   A-fib [facility])    Achalasia    Alzheimer's dementia ([place])    Aortic insufficiency    HTN (hypertension)    Prostate cancer ([place])    prostate   Renal disorder    stage 3 kidney failure   Sick sinus syndrome ([place])    pacemaker in remote past    Patient Active Problem List   Diagnosis Date Noted   Melena 04/15/2019   Achalasia 04/03/2019   Anemia in chronic kidney disease 04/03/2019    Past Surgical History:  Procedure Laterality Date   CHOLECYSTECTOMY     ESOPHAGOGASTRODUODENOSCOPY  2011   [facility]: dilated esophagus and tight LES, s/p 25 units Botox, multiple antral polyps s/p biopsy   ESOPHAGOGASTRODUODENOSCOPY  2016   Last EGD in 2016 at the [facility] with fluid-filled esophagus with dilatation, s/p dilation. Tight at EG junction. polypoid gastritis in  antrum   HELLER MYOTOMY  2012   [place] IMPLANT     pH and manometry  2012   motility compatible with achalasia or complete aperistalsis, or compatible with s/p Heller myotomy, acidity within esophagus.         Home Medications    Prior to Admission medications   Medication Sig Start Date End Date Taking? Authorizing Provider  albuterol (PROVENTIL) (2.5 MG/3ML) 0.083% nebulizer solution Inhale 3 mLs into the lungs as needed. 03/18/19   [provider]  calcitRIOL (ROCALTROL) 0.5 MCG capsule Take 1 capsule by mouth daily. 09/09/18   [provider]  donepezil (ARICEPT) 5 MG tablet Take 5 mg by mouth at bedtime.    [provider]  famotidine (PEPCID) 40 MG tablet Take 1 tablet by mouth 2 (two) times a day.    [provider]  lisinopril (ZESTRIL) 20 MG tablet Take 1 tablet by mouth daily. 03/18/19   [provider]  meloxicam (MOBIC) 7.5 MG tablet Take 1 tablet by mouth 2 (two) times a day. 03/25/19   [provider]  mirtazapine (REMERON) 15 MG tablet Take 1 tablet by mouth at bedtime. 03/25/19   [provider]  tamsulosin (FLOMAX) 0.4 MG CAPS capsule Take 1 capsule by mouth daily. 09/09/18   [provider]    Family History Family History  Problem Relation Age of Onset   Colon cancer Neg Hx  Colon polyps Neg Hx     Social History Social History   Tobacco Use   Smoking status: Never Smoker   Smokeless tobacco: Never Used  Substance Use Topics   Alcohol use: Not Currently    Comment: in the past   Drug use: Never     Allergies   Patient has no known allergies.   Review of Systems Review of Systems  All other systems reviewed and are negative.    Physical Exam Updated Vital Signs BP (!) 208/109    Pulse 72    Temp 97.6 F (36.4 C) (Oral)    Resp 16    Ht 6\' 6"  (1.981 m)    Wt 79.4 kg    SpO2 98%    BMI 20.22 kg/m   Physical Exam Vitals signs and nursing note  reviewed.  Constitutional:      General: He is not in acute distress.    Appearance: He is well-developed.  HENT:     Head: Normocephalic and atraumatic.  Eyes:     Conjunctiva/sclera: Conjunctivae normal.  Cardiovascular:     Rate and Rhythm: Normal rate and regular rhythm.  Pulmonary:     Effort: Pulmonary effort is normal. No respiratory distress.     Breath sounds: Normal breath sounds.  Abdominal:     General: Bowel sounds are normal.     Palpations: Abdomen is soft.     Tenderness: There is no abdominal tenderness. There is no guarding or rebound.  Genitourinary:    Comments: Exam performed with nurse tech chaperone present.  Stool is soft and brown. No gross blood or melena. Musculoskeletal:     Comments: 3+ pretibial pitting edema bilaterally (chronic)  Skin:    General: Skin is warm.  Neurological:     Mental Status: He is alert.  Psychiatric:        Behavior: Behavior normal.      ED Treatments / Results  Labs (all labs ordered are listed, but only abnormal results are displayed) Labs Reviewed  COMPREHENSIVE METABOLIC PANEL - Abnormal; Notable for the following components:      Result Value   Glucose, Bld 105 (*)    Creatinine, Ser 2.38 (*)    Calcium 8.7 (*)    Total Protein 5.7 (*)    Albumin 3.4 (*)    AST 11 (*)    GFR calc non Af Amer 25 (*)    GFR calc Af Amer 29 (*)    All other components within normal limits  CBC - Abnormal; Notable for the following components:   RBC 3.38 (*)    Hemoglobin 6.9 (*)    HCT 24.5 (*)    MCV 72.5 (*)    MCH 20.4 (*)    MCHC 28.2 (*)    RDW 20.1 (*)    Platelets 107 (*)    All other components within normal limits  IRON AND TIBC - Abnormal; Notable for the following components:   Iron 25 (*)    Saturation Ratios 6 (*)    All other components within normal limits  FERRITIN - Abnormal; Notable for the following components:   Ferritin 4 (*)    All other components within normal limits  RETICULOCYTES -  Abnormal; Notable for the following components:   RBC. 3.42 (*)    All other components within normal limits  POC OCCULT BLOOD, ED - Abnormal; Notable for the following components:   Fecal Occult Bld POSITIVE (*)    All other  components within normal limits  SARS CORONAVIRUS 2 (HOSPITAL ORDER, PERFORMED IN [place] [facility])  VITAMIN B12  FOLATE  URINALYSIS, ROUTINE W REFLEX MICROSCOPIC  TYPE AND SCREEN  PREPARE RBC (CROSSMATCH)  ABO/RH    EKG None  Radiology Dg Ugi W Double Cm (hd Ba)  Result Date: 04/15/2019 CLINICAL DATA:  Achalasia, dysphagia. EXAM: UPPER GI SERIES WITH KUB TECHNIQUE: After obtaining a scout radiograph a routine upper GI series was performed using thin and high density barium. FLUOROSCOPY TIME:  Radiation Exposure Index (if provided by the fluoroscopic device): 331.1 mGy. COMPARISON:  None. FINDINGS: Severe narrowing of the gastroesophageal junction is noted consistent with a history of achalasia. There is dilatation of the esophagus, particularly involving its distal portion. No definite mass is noted. However, there does appear to be some retained food debris within the distal esophagus. A large amount of the administered barium remained within the esophagus throughout the exam. Given the fact that there was a significant amount of retained barium within the distal esophagus, as well as the presence of the achalasia and food debris, barium tablet was not administered as it certainly would not have passed into the stomach. No definite hiatal hernia is noted. Visualization of the stomach was limited due to the achalasia. Persistent rounded defect is seen involving the gastric cardia which may represent external compression, but possible mass cannot be excluded. Contrast flowed slowly from the stomach into the duodenum. The duodenal bulb and sweep are unremarkable. Status post cholecystectomy. IMPRESSION: Severe narrowing of the gastroesophageal junction is noted  consistent with the given history of achalasia. There is significant dilatation of the esophagus with probable retained food debris seen in the distal stomach. Large amount of administered contrast remained within the esophagus throughout the exam, suggesting significant stenosis. Persistent rounded filling defect is seen involving the gastric cardia which may simply represent external compression, but possible mass cannot be excluded. Endoscopy is recommended for further evaluation. Electronically Signed   By: Marijo Conception M.D.   On: 04/15/2019 11:41    Procedures Procedures (including critical care time)  Medications Ordered in ED Medications  0.9 %  sodium chloride infusion (Manually program via Guardrails IV Fluids) (has no administration in time range)  hydrALAZINE (APRESOLINE) injection 10 mg (has no administration in time range)  pantoprazole (PROTONIX) injection 40 mg (40 mg Intravenous Given 04/15/19 1459)     Initial Impression / Assessment and Plan / ED Course  I have reviewed the triage vital signs and the nursing notes.  Pertinent labs & imaging results that were available during my care of the patient were reviewed by me and considered in my medical decision making (see chart for details).  Clinical Course as of Apr 14 1622  Wed Apr 15, 2019  1556 Dr. Cruzita Lederer accepting admission.   [JR]    Clinical Course User Index [JR] Maxxon Schwanke, [place] N, PA-C   CRITICAL CARE Performed by: [place] N Burnard Enis   Total critical care time: 45 minutes  Critical care time was exclusive of separately billable procedures and treating other patients.  Critical care was necessary to treat or prevent imminent or life-threatening deterioration.  Critical care was time spent personally by me on the following activities: development of treatment plan with patient and/or surrogate as well as nursing, discussions with consultants, evaluation of patient's response to treatment, examination of  patient, obtaining history from patient or surrogate, ordering and performing treatments and interventions, ordering and review of laboratory studies, ordering and review  of radiographic studies, pulse oximetry and re-evaluation of patient's condition.     Patient presenting with low hemoglobin level outpatient from GI office.  Followed by [facility].  Labs were drawn during your visit for his achalasia.  He does note some intermittent melena, is unable to state for how long though may be multiple weeks.  Not on anticoagulation.  He does have generalized fatigue and weakness, with some shortness of breath on exertion.  His daughter states he is been sleeping more frequently than usual.  He has a history of chronic anemia, however is never required transfusion.  On arrival vital signs are stable, hemoglobin is significantly low at 6.9, hematocrit 24.5.  Creatinine is 2.38, he has history of CKD unsure of baseline.  Fecal occult is positive, however on rectal exam stool brown.  Given his symptomatic anemia, patient transfuse 1 unit immediately.  Dr. Gala Romney with GI consulted, and will see patient during admission.  Dr. Cruzita Lederer accepting admission to symptomatic anemia with gi bleed.  The patient appears reasonably stabilized for admission considering the current resources, flow, and capabilities available in the ED at this time, and I doubt any other [facility] requiring further screening and/or treatment in the ED prior to admission.   Final Clinical Impressions(s) / ED Diagnoses   Final diagnoses:  Symptomatic anemia  Gastrointestinal hemorrhage, unspecified gastrointestinal hemorrhage type    ED Discharge Orders    None       Lashika Erker, [place] N, PA-C 04/15/19 1624    Milton Ferguson, MD 04/21/19 1300

## 2019-04-15 NOTE — Telephone Encounter (Signed)
Labs ordered at time of visit and now completed several weeks later.   I do not know baseline Hgb. History of CKD. Hgb 6.9, ferritin 4. No overt GI bleeding at time of appointment. Labs consistent with multifactorial anemia in setting of IDA and chronic disease.  Please call patient and see if any shortness of breath, dyspnea on exertion, chest pain, significant weakness, fatigue, black stool, bright red blood in stool. IF any of these, needs to present to ED. He has multiple comorbidities including aortic insufficiency, afib, sick sinus syndrome and has pacemaker. GI history notable for achalasia and was a new patient to [place] end of July 2020.

## 2019-04-15 NOTE — Telephone Encounter (Signed)
T/C from [place] at [place] lab, pt has critical hemoglobin of 6.9.  Roseanne Kaufman, NP has been made aware.

## 2019-04-16 ENCOUNTER — Observation Stay (HOSPITAL_COMMUNITY): Payer: Medicare HMO

## 2019-04-16 ENCOUNTER — Observation Stay (HOSPITAL_COMMUNITY): Payer: Medicare HMO | Admitting: Anesthesiology

## 2019-04-16 ENCOUNTER — Encounter (HOSPITAL_COMMUNITY)
Admission: EM | Disposition: A | Discharge status: Home / Self Care | Payer: Self-pay | Source: Home / Self Care | Attending: Family Medicine

## 2019-04-16 ENCOUNTER — Other Ambulatory Visit: Payer: Self-pay

## 2019-04-16 ENCOUNTER — Encounter (HOSPITAL_COMMUNITY): Payer: Self-pay | Admitting: *Deleted

## 2019-04-16 DIAGNOSIS — K922 Gastrointestinal hemorrhage, unspecified: Secondary | ICD-10-CM | POA: Diagnosis not present

## 2019-04-16 DIAGNOSIS — R933 Abnormal findings on diagnostic imaging of other parts of digestive tract: Secondary | ICD-10-CM

## 2019-04-16 DIAGNOSIS — D500 Iron deficiency anemia secondary to blood loss (chronic): Secondary | ICD-10-CM | POA: Diagnosis not present

## 2019-04-16 DIAGNOSIS — K317 Polyp of stomach and duodenum: Secondary | ICD-10-CM

## 2019-04-16 DIAGNOSIS — K921 Melena: Secondary | ICD-10-CM | POA: Diagnosis not present

## 2019-04-16 DIAGNOSIS — R1310 Dysphagia, unspecified: Secondary | ICD-10-CM

## 2019-04-16 DIAGNOSIS — R1319 Other dysphagia: Secondary | ICD-10-CM

## 2019-04-16 HISTORY — PX: POLYPECTOMY: SHX5525

## 2019-04-16 HISTORY — PX: ESOPHAGEAL DILATION: SHX303

## 2019-04-16 HISTORY — PX: ESOPHAGOGASTRODUODENOSCOPY (EGD) WITH PROPOFOL: SHX5813

## 2019-04-16 LAB — COMPREHENSIVE METABOLIC PANEL
ALT: 10 U/L (ref 0–44)
AST: 11 U/L — ABNORMAL LOW (ref 15–41)
Albumin: 3 g/dL — ABNORMAL LOW (ref 3.5–5.0)
Alkaline Phosphatase: 45 U/L (ref 38–126)
Anion gap: 5 (ref 5–15)
BUN: 22 mg/dL (ref 8–23)
CO2: 27 mmol/L (ref 22–32)
Calcium: 8.7 mg/dL — ABNORMAL LOW (ref 8.9–10.3)
Chloride: 113 mmol/L — ABNORMAL HIGH (ref 98–111)
Creatinine, Ser: 2.19 mg/dL — ABNORMAL HIGH (ref 0.61–1.24)
GFR calc Af Amer: 32 mL/min — ABNORMAL LOW (ref >60)
GFR calc non Af Amer: 28 mL/min — ABNORMAL LOW (ref >60)
Glucose, Bld: 81 mg/dL (ref 70–99)
Potassium: 3.6 mmol/L (ref 3.5–5.1)
Sodium: 145 mmol/L (ref 135–145)
Total Bilirubin: 0.8 mg/dL (ref 0.3–1.2)
Total Protein: 4.9 g/dL — ABNORMAL LOW (ref 6.5–8.1)

## 2019-04-16 LAB — BPAM RBC
Blood Product Expiration Date: 202008242359
ISSUE DATE / TIME: 202008121620
Unit Type and Rh: 6200

## 2019-04-16 LAB — TYPE AND SCREEN
ABO/RH(D): A POS
Antibody Screen: NEGATIVE
Unit division: 0

## 2019-04-16 LAB — CBC
HCT: 24.4 % — ABNORMAL LOW (ref 39.0–52.0)
Hemoglobin: 7.2 g/dL — ABNORMAL LOW (ref 13.0–17.0)
MCH: 21.2 pg — ABNORMAL LOW (ref 26.0–34.0)
MCHC: 29.5 g/dL — ABNORMAL LOW (ref 30.0–36.0)
MCV: 72 fL — ABNORMAL LOW (ref 80.0–100.0)
Platelets: 98 10*3/uL — ABNORMAL LOW (ref 150–400)
RBC: 3.39 MIL/uL — ABNORMAL LOW (ref 4.22–5.81)
RDW: 19.9 % — ABNORMAL HIGH (ref 11.5–15.5)
WBC: 4.1 10*3/uL (ref 4.0–10.5)
nRBC: 0 % (ref 0.0–0.2)

## 2019-04-16 SURGERY — ESOPHAGOGASTRODUODENOSCOPY (EGD) WITH PROPOFOL
Anesthesia: General

## 2019-04-16 MED ORDER — LACTATED RINGERS IV SOLN
INTRAVENOUS | Status: DC
  Administered 2019-04-16: 13:00:00 via INTRAVENOUS

## 2019-04-16 MED ORDER — KETAMINE HCL 50 MG/5ML IJ SOSY
PREFILLED_SYRINGE | INTRAMUSCULAR | Status: AC
  Filled 2019-04-16: qty 5

## 2019-04-16 MED ORDER — HYDROCODONE-ACETAMINOPHEN 7.5-325 MG PO TABS: 1.0000 | ORAL_TABLET | Freq: Once | ORAL | Status: DC | PRN

## 2019-04-16 MED ORDER — SODIUM CHLORIDE 0.9 % IV SOLN: INTRAVENOUS | Status: DC

## 2019-04-16 MED ORDER — PROPOFOL 500 MG/50ML IV EMUL
INTRAVENOUS | Status: DC | PRN
  Administered 2019-04-16: 150 ug/kg/min via INTRAVENOUS

## 2019-04-16 MED ORDER — KETAMINE HCL 10 MG/ML IJ SOLN
INTRAMUSCULAR | Status: DC | PRN
  Administered 2019-04-16: 10 mg via INTRAVENOUS

## 2019-04-16 MED ORDER — MEMANTINE HCL ER 7 MG PO CP24
7.0000 mg | ORAL_CAPSULE | Freq: Every day | ORAL | Status: DC
  Administered 2019-04-16 – 2019-04-19 (×2): 7 mg via ORAL
  Filled 2019-04-16 (×5): qty 1

## 2019-04-16 MED ORDER — IOHEXOL 300 MG/ML  SOLN: 75.0000 mL | Freq: Once | INTRAMUSCULAR | Status: DC | PRN

## 2019-04-16 MED ORDER — DONEPEZIL HCL 5 MG PO TABS
10.0000 mg | ORAL_TABLET | Freq: Every day | ORAL | Status: DC
  Administered 2019-04-17 – 2019-04-18 (×2): 10 mg via ORAL
  Filled 2019-04-16 (×4): qty 2

## 2019-04-16 MED ORDER — LABETALOL HCL 5 MG/ML IV SOLN
INTRAVENOUS | Status: AC
  Filled 2019-04-16: qty 4

## 2019-04-16 MED ORDER — PROPOFOL 10 MG/ML IV BOLUS
INTRAVENOUS | Status: AC
  Filled 2019-04-16: qty 40

## 2019-04-16 MED ORDER — PROPOFOL 10 MG/ML IV BOLUS
INTRAVENOUS | Status: DC | PRN
  Administered 2019-04-16 (×2): 20 mg via INTRAVENOUS

## 2019-04-16 MED ORDER — HYDROMORPHONE HCL 1 MG/ML IJ SOLN: 0.2500 mg | INTRAMUSCULAR | Status: DC | PRN

## 2019-04-16 MED ORDER — SUCCINYLCHOLINE CHLORIDE 20 MG/ML IJ SOLN
INTRAMUSCULAR | Status: DC | PRN
  Administered 2019-04-16: 120 mg via INTRAVENOUS

## 2019-04-16 MED ORDER — PROMETHAZINE HCL 25 MG/ML IJ SOLN: 6.2500 mg | INTRAMUSCULAR | Status: DC | PRN

## 2019-04-16 MED ORDER — LIDOCAINE HCL (PF) 1 % IJ SOLN
INTRAMUSCULAR | Status: AC
  Filled 2019-04-16: qty 5

## 2019-04-16 MED ORDER — LABETALOL HCL 5 MG/ML IV SOLN
INTRAVENOUS | Status: DC | PRN
  Administered 2019-04-16: 5 mg via INTRAVENOUS

## 2019-04-16 MED ORDER — MIDAZOLAM HCL 2 MG/2ML IJ SOLN: 0.5000 mg | Freq: Once | INTRAMUSCULAR | Status: DC | PRN

## 2019-04-16 MED ORDER — FAMOTIDINE 20 MG PO TABS
20.0000 mg | ORAL_TABLET | Freq: Every day | ORAL | Status: DC
  Administered 2019-04-16 – 2019-04-19 (×4): 20 mg via ORAL
  Filled 2019-04-16 (×3): qty 1

## 2019-04-16 MED ORDER — IOHEXOL 300 MG/ML  SOLN
75.0000 mL | Freq: Once | INTRAMUSCULAR | Status: AC | PRN
  Administered 2019-04-16: 75 mL via INTRAVENOUS

## 2019-04-16 NOTE — Op Note (Addendum)
[facility] Patient Name: Curtis Mcintosh Procedure Date: 04/16/2019 1:00 PM MRN: [id number] Date of Birth: [birth year] Attending MD: Norvel Richards , MD CSN: [id number] Age: 78 Admit Type: Inpatient Procedure:                Upper GI endoscopy Indications:              Dysphagia, Melena Providers:                Norvel Richards, MD, Jeanann Lewandowsky. Sharon Seller, RN,                            Raphael Gibney, Technician Referring MD:              Medicines:                Propofol per Anesthesia Complications:            No immediate complications. Estimated Blood Loss:     Estimated blood loss: none. Procedure:                Pre-Anesthesia Assessment:                           - Prior to the procedure, a History and Physical                            was performed, and patient medications and                            allergies were reviewed. The patient's tolerance of                            previous anesthesia was also reviewed. The risks                            and benefits of the procedure and the sedation                            options and risks were discussed with the patient.                            All questions were answered, and informed consent                            was obtained. Prior Anticoagulants: The patient has                            taken no previous anticoagulant or antiplatelet                            agents. ASA Grade Assessment: III - A patient with                            severe systemic disease. After reviewing the risks  and benefits, the patient was deemed in                            satisfactory condition to undergo the procedure.                           After obtaining informed consent, the endoscope was                            passed under direct vision. Throughout the                            procedure, the patient's blood pressure, pulse, and                            oxygen saturations  were monitored continuously. The                            GIF-H190 ([id number]) scope was introduced through the                            mouth, and advanced to the second part of duodenum.                            The upper GI endoscopy was accomplished without                            difficulty. The patient tolerated the procedure                            well. Scope In: 1:11:10 PM Scope Out: 1:42:37 PM Total Procedure Duration: 0 hours 31 minutes 27 seconds  Findings:      Massively dilated esophagus with retained food debris/stasis. GE       junction was closed. It did admit the scope rather easily. There was no       fixed fibrotic stricture ring web or evidence of neoplasm on the       esophageal side. Retained barium contrast and some food debris in the       stomach. 1cm hemorrhagic pedunculated polyp on the antral/prepyloric       mucosa. Evidence of prior surgery at the GE junction with submucosal       mass-effect at the cardia of uncertain significance. No obvious tumor.      Because of large amount of retained food in the esophagus; we elected to       intubate the patient to secure the airway. Scope was removed. Patient       was intubated by anesthesia. Scope reintroduced into the upper GI tract       exam completed. Pylorus is patent. Easily traversed. D1 and D2 appeared       normal.      Scope was withdrawn, a 56 [place] Maloney dilator was passed to full       insertion insertion with slight resistance. A look back revealed no       apparent complication related to this maneuver. Finally, attention was  turned to the antral polyp. The stalk was clipped with a hemostasis clip       subsequently using bipolar current the polyp was removed totally with 1       pass hot snare cautery. It was recovered with a rescue net. Good       hemostasis. Impression:               -Massively dilated esophagus with stasis of food.                            Fixed stricture  status post Maloney dilation. We                            altered/abnormal appearing cardia retroflexed of                            uncertain significance. Need to rule out                            pseudo-achalasia. Gastric polyp status post snare                            polypectomy and hemostasis clipping                           I am concerned about the possibility of                            pseudo-achalasia from external mass-effect. CT                            needed. If that is found not to be the case, I                            would be concerned that previous myotomy was not                            complete and patient has continued pathological                            contraction of the LES (classic "bird beaking"                            persists on barium study).                           Follow-up on pathology. Clear liquid diet. CT of                            the GE junction. Further recommendations to follow.                            Patient may need another manometry. May also need  further evaluation of anemia.                           No Future MRI until clip gone. I have discussed my                            findings and recommendations with POA, Ailene Ravel at [phone] Moderate Sedation:      Moderate (conscious) sedation was personally administered by an       anesthesia professional. The following parameters were monitored: oxygen       saturation, heart rate, blood pressure, respiratory rate, EKG, adequacy       of pulmonary ventilation, and response to care. Recommendation:            Procedure Code(s):        --- Professional ---                           [id number], Esophagogastroduodenoscopy, flexible,                            transoral; diagnostic, including collection of                            specimen(s) by brushing or washing, when performed                             (separate procedure) Diagnosis Code(s):        --- Professional ---                           R13.10, Dysphagia, unspecified                           K92.1, Melena (includes Hematochezia) CPT copyright 2019 American Medical Association. All rights reserved. The codes documented in this report are preliminary and upon coder review may  be revised to meet current compliance requirements. Cristopher Estimable. Rourk, MD Norvel Richards, MD 04/16/2019 2:12:50 PM This report has been signed electronically. Number of Addenda: 0

## 2019-04-16 NOTE — Anesthesia Preprocedure Evaluation (Addendum)
Anesthesia Evaluation  Patient identified by MRN, date of birth, ID band Patient awake  General Assessment Comment:Per chart unknown FH of reaction to anesthesia   Reviewed: Allergy & Precautions, NPO status , Patient's Chart, lab work & pertinent test results  History of Anesthesia Complications (+) Family history of anesthesia reaction  Airway Mallampati: II  TM Distance: >3 FB Neck ROM: Full    Dental no notable dental hx. (+) Edentulous Upper   Pulmonary neg pulmonary ROS,    Pulmonary exam normal breath sounds clear to auscultation       Cardiovascular Exercise Tolerance: Poor hypertension, Pt. on medications Normal cardiovascular exam+ dysrhythmias Atrial Fibrillation + pacemaker II+ Valvular Problems/Murmurs AI  Rhythm:Regular Rate:Normal  Pt with pacer for SSS Known AI,AFib    Neuro/Psych PSYCHIATRIC DISORDERS Dementia negative neurological ROS     GI/Hepatic negative GI ROS, Neg liver ROS, S/p hellers myotomy /fundiplication - with post surgical dil  Now with anemia -here for EGD / poss dil  Phone consent obtained by daughter   Endo/Other  negative endocrine ROS  Renal/GU Renal InsufficiencyRenal disease  negative genitourinary   Musculoskeletal negative musculoskeletal ROS (+)   Abdominal   Peds negative pediatric ROS (+)  Hematology negative hematology ROS (+) anemia , Last h/h 7.2/24   Anesthesia Other Findings   Reproductive/Obstetrics negative OB ROS                           Anesthesia Physical Anesthesia Plan  ASA: IV  Anesthesia Plan: General   Post-op Pain Management:    Induction: Intravenous  PONV Risk Score and Plan: 2 and Propofol infusion and TIVA  Airway Management Planned: Nasal Cannula and Simple Face Mask  Additional Equipment:   Intra-op Plan:   Post-operative Plan:   Informed Consent: I have reviewed the patients History and Physical, chart,  labs and discussed the procedure including the risks, benefits and alternatives for the proposed anesthesia with the patient or authorized representative who has indicated his/her understanding and acceptance.     Dental advisory given  Plan Discussed with: CRNA  Anesthesia Plan Comments: (Plan Full PPE use  Plan GA with GETA as needed -d/w daughter -witnessed by RN -WTP with same )        Anesthesia Quick Evaluation

## 2019-04-16 NOTE — Anesthesia Postprocedure Evaluation (Signed)
Anesthesia Post Note  Patient: Curtis Mcintosh  Procedure(s) Performed: ESOPHAGOGASTRODUODENOSCOPY (EGD) WITH PROPOFOL (N/A ) ESOPHAGEAL DILATION (N/A ) POLYPECTOMY  Patient location during evaluation: PACU Anesthesia Type: General Level of consciousness: awake and alert, oriented and patient cooperative Pain management: pain level controlled Vital Signs Assessment: post-procedure vital signs reviewed and stable Respiratory status: spontaneous breathing Cardiovascular status: stable Postop Assessment: no apparent nausea or vomiting Anesthetic complications: no     Last Vitals:  Vitals:   04/16/19 1352 04/16/19 1401  BP: (!) 174/86 (!) 163/83  Pulse: 72 67  Resp: 20 16  Temp: 36.6 C   SpO2: 100% 98%    Last Pain:  Vitals:   04/16/19 1401  TempSrc:   PainSc: 0-No pain                 ADAMS, AMY A

## 2019-04-16 NOTE — Transfer of Care (Signed)
Immediate Anesthesia Transfer of Care Note  Patient: Curtis Mcintosh  Procedure(s) Performed: ESOPHAGOGASTRODUODENOSCOPY (EGD) WITH PROPOFOL (N/A ) ESOPHAGEAL DILATION (N/A ) POLYPECTOMY  Patient Location: PACU  Anesthesia Type:General  Level of Consciousness: drowsy and patient cooperative  Airway & Oxygen Therapy: Patient Spontanous Breathing and Patient connected to face mask oxygen  Post-op Assessment: Report given to RN and Post -op Vital signs reviewed and stable  Post vital signs: Reviewed and stable  Last Vitals:  Vitals Value Taken Time  BP 174/86 04/16/19 1352  Temp    Pulse 67 04/16/19 1355  Resp 22 04/16/19 1355  SpO2 100 % 04/16/19 1355  Vitals shown include unvalidated device data.  Last Pain:  Vitals:   04/16/19 1305  TempSrc:   PainSc: 0-No pain         Complications: No apparent anesthesia complications

## 2019-04-16 NOTE — Anesthesia Procedure Notes (Signed)
Procedure Name: Intubation Date/Time: 04/16/2019 1:16 PM Performed by: Andree Elk, Amy A, CRNA Pre-anesthesia Checklist: Patient identified, Patient being monitored, Timeout performed, Emergency Drugs available and Suction available Patient Re-evaluated:Patient Re-evaluated prior to induction Oxygen Delivery Method: Circle system utilized Preoxygenation: Pre-oxygenation with 100% oxygen Induction Type: IV induction Ventilation: Mask ventilation without difficulty Laryngoscope Size: Mac and 3 Grade View: Grade I Tube type: Oral Tube size: 7.0 mm Number of attempts: 1 Airway Equipment and Method: Stylet Placement Confirmation: ETT inserted through vocal cords under direct vision,  positive ETCO2 and breath sounds checked- equal and bilateral Secured at: 21 cm Tube secured with: Tape Dental Injury: Teeth and Oropharynx as per pre-operative assessment

## 2019-04-16 NOTE — Consult Note (Signed)
Referring Provider: Roxan Hockey, MD Primary Care Physician:  Monico Blitz, MD Primary Gastroenterologist:  Garfield Cornea, MD  Reason for Consultation:  Anemia, gi bleed  HPI: Curtis Mcintosh is a 78 y.o. male with multiple comorbidities including aortic insufficiency, A. fib, sick sinus syndrome with pacemaker, stage III chronic kidney disease, dementia, history of achalasia status post Heller myotomy with fundoplication in 6948, recently seen for initial evaluation by our practice 2 weeks ago with complaints of dysphagia.  Last EGD 2016 at [facility] with fluid-filled dilated esophagus tight at the EG junction, status post dilation.  Noted improvement of dysphagia at the time.  Patient finds it difficult to swallow solids.  Everything gets to the midesophagus and can't go down.  Often regurgitates food.  Has to pound chest to get it to go down.  Liquids go down ok but has to drink really slow or they will come back up. No heartburn. No abdominal pain. Had been on omeprazole but according to the daughter, his nephrologist discontinued PPI in the setting of stage III chronic kidney disease at the end of 2019.  Has been on Pepcid 40 mg twice daily.  Also reported anemia at time of office visit, labs were ordered.  Yesterday, upper GI series showed severe narrowing at the GE junction consistent with history of achalasia.  Significant dilation of the esophagus with probable retained food debris seen in the distal stomach.  Large amount of administered contrast remained within the esophagus throughout the exam, suggesting significant stenosis.  Persistent rounded filling defect seen involving the gastric cardia, cannot exclude mass.  Endoscopy recommended.  Labs yesterday revealed hemoglobin of 6.9, hematocrit 24.5, MCV 72.5, platelets 109,000, white blood cell count 3600.  Albumin 3.3, creatinine 2.36, BUN 22, ferritin 4, iron 22, TIBC 382, iron saturation 6%.  B12 low normal and folate normal. Because  of reported melena, he was instructed to go to the ED. He is on Mobic daily.    In the ED, stool was noted to be brown, heme positive.  SARS coronavirus 2 was negative.  Received 1 unit of packed red blood cells yesterday, hemoglobin this morning 7.2.  Platelets 98,000.  Prior to Admission medications   Medication Sig Start Date End Date Taking? Authorizing Provider  albuterol (PROVENTIL) (2.5 MG/3ML) 0.083% nebulizer solution Inhale 3 mLs into the lungs every 6 (six) hours as needed for wheezing or shortness of breath.  03/18/19  Yes [provider]  calcitRIOL (ROCALTROL) 0.5 MCG capsule Take 0.5 mcg by mouth every morning.  09/09/18  Yes [provider]  famotidine (PEPCID) 40 MG tablet Take 40 mg by mouth 2 (two) times a day.    Yes [provider]  lisinopril (ZESTRIL) 20 MG tablet Take 20 mg by mouth every morning.  03/18/19  Yes [provider]  meloxicam (MOBIC) 7.5 MG tablet Take 7.5 mg by mouth daily as needed for pain (Take with meals as needed for pain).  03/25/19  Yes [provider]  Memantine HCl-Donepezil HCl (NAMZARIC) 7-10 MG CP24 Take 1 capsule by mouth every morning.   Yes [provider]  mirtazapine (REMERON) 15 MG tablet Take 15 mg by mouth at bedtime.  03/25/19  Yes [provider]  tamsulosin (FLOMAX) 0.4 MG CAPS capsule Take 0.4 mg by mouth every morning.  09/09/18  Yes [provider]  Vitamin D, Cholecalciferol, 10 MCG (400 UNIT) TABS Take 2 tablets by mouth every morning.   Yes [provider]  Current Facility-Administered Medications  Medication Dose Route Frequency Provider Last Rate Last Dose  . acetaminophen (TYLENOL) tablet 650 mg  650 mg Oral Q6H PRN Caren Griffins, MD       Or  . acetaminophen (TYLENOL) suppository 650 mg  650 mg Rectal Q6H PRN Gherghe, Costin M, MD      . albuterol (PROVENTIL) (2.5 MG/3ML) 0.083% nebulizer solution 3 mL  3 mL Inhalation Q6H PRN Caren Griffins, MD      . calcitRIOL (ROCALTROL) capsule 0.5 mcg  0.5 mcg Oral q morning - 10a Gherghe, Costin M, MD      . cholecalciferol (VITAMIN D3) tablet 800 Units  800 Units Oral q morning - 10a Gherghe, Costin M, MD      . cyanocobalamin ((VITAMIN B-12)) injection 1,000 mcg  1,000 mcg Intramuscular Once Caren Griffins, MD   Stopped at 04/15/19 2029  . famotidine (PEPCID) tablet 40 mg  40 mg Oral Daily Gherghe, Costin M, MD      . ferrous sulfate tablet 325 mg  325 mg Oral TID WC Gherghe, Costin M, MD      . hydrALAZINE (APRESOLINE) injection 10 mg  10 mg Intravenous Q4H PRN Caren Griffins, MD   10 mg at 04/15/19 2029  . Memantine HCl-Donepezil HCl 7-10 MG CP24 1 capsule  1 capsule Oral q morning - 10a Gherghe, Costin M, MD      . mirtazapine (REMERON) tablet 15 mg  15 mg Oral QHS Caren Griffins, MD   15 mg at 04/15/19 2204  . ondansetron (ZOFRAN) tablet 4 mg  4 mg Oral Q6H PRN Caren Griffins, MD       Or  . ondansetron (ZOFRAN) injection 4 mg  4 mg Intravenous Q6H PRN Caren Griffins, MD      . tamsulosin (FLOMAX) capsule 0.4 mg  0.4 mg Oral q morning - 10a Caren Griffins, MD        Allergies as of 04/15/2019  . (No Known Allergies)    Past Medical History:  Diagnosis Date  . A-fib ([place])   . Achalasia   . Alzheimer's dementia ([place])   . Aortic insufficiency   . HTN (hypertension)   . Prostate cancer [facility])    prostate  . Renal disorder    stage 3 kidney failure  . Sick sinus syndrome [facility])    pacemaker in remote past    Past Surgical History:  Procedure Laterality Date  . CHOLECYSTECTOMY    . ESOPHAGOGASTRODUODENOSCOPY  2011   [facility]: dilated esophagus and tight LES, s/p 25 units Botox, multiple antral polyps s/p biopsy  . ESOPHAGOGASTRODUODENOSCOPY  2016   Last EGD in 2016 at the [facility] with fluid-filled esophagus with dilatation, s/p dilation. Tight at EG junction. polypoid gastritis in antrum  . HELLER MYOTOMY  2012   [facility]  . PACEMAKER  IMPLANT    . pH and manometry  2012   motility compatible with achalasia or complete aperistalsis, or compatible with s/p Heller myotomy, acidity within esophagus.     Family History  Problem Relation Age of Onset  . Colon cancer Neg Hx   . Colon polyps Neg Hx     Social History   Socioeconomic History  . Marital status: Divorced    Spouse name: Not on file  . Number of children: Not on file  . Years of education: Not on file  . Highest education level: Not on file  Occupational History  .  Not on file  Social Needs  . Financial resource strain: Not on file  . Food insecurity    Worry: Not on file    Inability: Not on file  . Transportation needs    Medical: Not on file    Non-medical: Not on file  Tobacco Use  . Smoking status: Never Smoker  . Smokeless tobacco: Never Used  Substance and Sexual Activity  . Alcohol use: Not Currently    Comment: in the past  . Drug use: Never  . Sexual activity: Not on file  Lifestyle  . Physical activity    Days per week: Not on file    Minutes per session: Not on file  . Stress: Not on file  Relationships  . Social Herbalist on phone: Not on file    Gets together: Not on file    Attends religious service: Not on file    Active member of club or organization: Not on file    Attends meetings of clubs or organizations: Not on file    Relationship status: Not on file  . Intimate partner violence    Fear of current or ex partner: Not on file    Emotionally abused: Not on file    Physically abused: Not on file    Forced sexual activity: Not on file  Other Topics Concern  . Not on file  Social History Narrative  . Not on file     ROS: question reliability  General: Negative for anorexia, weight loss, fever, chills, fatigue, weakness. Eyes: Negative for vision changes.  ENT: Negative for hoarseness, nasal congestion. See hpi CV: Negative for chest pain, angina, palpitations, dyspnea on exertion, peripheral edema.   Respiratory: Negative for dyspnea at rest, dyspnea on exertion, cough, sputum, wheezing.  GI: See history of present illness. GU:  Negative for dysuria, hematuria, urinary incontinence, urinary frequency, nocturnal urination.  MS: Negative for joint pain, low back pain.  Derm: Negative for rash or itching.  Neuro: Negative for weakness, abnormal sensation, seizure, frequent headaches,+ memory loss, confusion.  Psych: Negative for anxiety, depression, suicidal ideation, hallucinations.  Endo: Negative for unusual weight change.  Heme: Negative for bruising or bleeding. Allergy: Negative for rash or hives.       Physical Examination: Vital signs in last 24 hours: Temp:  [97.6 F (36.4 C)-97.7 F (36.5 C)] 97.7 F (36.5 C) (08/12 2056) Pulse Rate:  [62-72] 71 (08/12 2056) Resp:  [16-22] 20 (08/12 2056) BP: (102-213)/(64-109) 102/64 (08/12 2056) SpO2:  [98 %-100 %] 100 % (08/12 2056) Weight:  [79.4 kg] 79.4 kg (08/12 1241) Last BM Date: 04/15/19  General: Well-nourished, well-developed in no acute distress.  Head: Normocephalic, atraumatic.   Eyes: Conjunctiva pink, no icterus. Mouth: Oropharyngeal mucosa moist and pink ,   Neck: Supple without thyromegaly, masses, or lymphadenopathy.  Lungs: Clear to auscultation bilaterally.  Heart: Regular rate and rhythm, no murmurs rubs or gallops.  Abdomen: Bowel sounds are normal,   nondistended, no hepatosplenomegaly or masses, no abdominal bruits or    hernia , no rebound or guarding.  Mild epigastric tenderness Rectal: done in ED Extremities: No lower extremity edema, clubbing, deformity.  Neuro: Alert and oriented x 2 , grossly normal neurologically.  Skin: Warm and dry, no rash or jaundice.   Psych: Alert and cooperative, normal mood and affect.        Intake/Output from previous day: 08/12 0701 - 08/13 0700 In: 684.8 [I.V.:36.8; Blood:648] Out: -  Intake/Output this  shift: No intake/output data recorded.  Lab  Results: CBC Recent Labs    04/15/19 0907 04/15/19 1341 04/16/19 0358  WBC 3.6* 4.4 4.1  HGB 6.9* 6.9* 7.2*  HCT 24.5* 24.5* 24.4*  MCV 72.5* 72.5* 72.0*  PLT 109* 107* 98*   BMET Recent Labs    04/15/19 0907 04/15/19 1341 04/16/19 0358  NA 145 144 145  K 4.0 3.6 3.6  CL 109 111 113*  CO2 29 28 27   GLUCOSE 95 105* 81  BUN 22 22 22   CREATININE 2.36* 2.38* 2.19*  CALCIUM 8.8* 8.7* 8.7*   LFT Recent Labs    04/15/19 0907 04/15/19 1341 04/16/19 0358  BILITOT 0.4 0.4 0.8  ALKPHOS 40 41 45  AST 12* 11* 11*  ALT 11 11 10   PROT 5.6* 5.7* 4.9*  ALBUMIN 3.3* 3.4* 3.0*    Lipase No results for input(s): LIPASE in the last 72 hours.  PT/INR No results for input(s): LABPROT, INR in the last 72 hours.    Imaging Studies: Dg Ugi W Double Cm (hd Ba)  Result Date: 04/15/2019 CLINICAL DATA:  Achalasia, dysphagia. EXAM: UPPER GI SERIES WITH KUB TECHNIQUE: After obtaining a scout radiograph a routine upper GI series was performed using thin and high density barium. FLUOROSCOPY TIME:  Radiation Exposure Index (if provided by the fluoroscopic device): 331.1 mGy. COMPARISON:  None. FINDINGS: Severe narrowing of the gastroesophageal junction is noted consistent with a history of achalasia. There is dilatation of the esophagus, particularly involving its distal portion. No definite mass is noted. However, there does appear to be some retained food debris within the distal esophagus. A large amount of the administered barium remained within the esophagus throughout the exam. Given the fact that there was a significant amount of retained barium within the distal esophagus, as well as the presence of the achalasia and food debris, barium tablet was not administered as it certainly would not have passed into the stomach. No definite hiatal hernia is noted. Visualization of the stomach was limited due to the achalasia. Persistent rounded defect is seen involving the gastric cardia which may  represent external compression, but possible mass cannot be excluded. Contrast flowed slowly from the stomach into the duodenum. The duodenal bulb and sweep are unremarkable. Status post cholecystectomy. IMPRESSION: Severe narrowing of the gastroesophageal junction is noted consistent with the given history of achalasia. There is significant dilatation of the esophagus with probable retained food debris seen in the distal stomach. Large amount of administered contrast remained within the esophagus throughout the exam, suggesting significant stenosis. Persistent rounded filling defect is seen involving the gastric cardia which may simply represent external compression, but possible mass cannot be excluded. Endoscopy is recommended for further evaluation. Electronically Signed   By: Marijo Conception M.D.   On: 04/15/2019 11:41  [4 week]   Impression: 78 y/o male presenting to ED with profound IDA discovered on outpatient labs and because of reported melena for 2-3 weeks he was advised to go to the ED for evaluation.  He has a history of achalasia status post Heller myotomy with fundoplication in 6387, has required esophageal dilation since that time (2016 last EGD).  Has been having significant dysphagia with markedly abnormal upper GI series yesterday.  Significant dilatation of esophagus with retained food debris within the distal esophagus and a large amount of administered barium remained throughout the exam suggesting significant stenosis.  Persistent rounded defect seen in the gastric cardia which could represent external compression but mass  cannot be excluded.  Appears he has had further decline in Hgb over the past six months. Labs from January 2020 through [facility] showed hemoglobin of 7.9, MCV 72.3, platelets 151,000, white blood cell count 3900, iron low at 32.  Notably platelet counts have been low in 2018, 98,000.  Plan: 1. Plan for EGD/ED with propofol today.  I have discussed the risks,  alternatives, benefits with regards to but not limited to the risk of reaction to medication, bleeding, infection, perforation and the patient is agreeable to proceed. Written consent to be obtained. I have left message with patient's daughter, Ailene Ravel to update on clinical status.   We would like to thank you for the opportunity to participate in the care of [facility].  Laureen Ochs. Bernarda Caffey [facility] [phone] 8/13/20209:16 AM     LOS: 0 days

## 2019-04-16 NOTE — Progress Notes (Signed)
Patient Demographics:    Curtis Mcintosh, is a 78 y.o. male, DOB - 1941/06/15, CWU:[id number]  Admit date - 04/15/2019   Admitting Physician Costin Karlyne Greenspan, MD  Outpatient Primary MD for the patient is Monico Blitz, MD  LOS - 0   Chief Complaint  Patient presents with   Abnormal Lab        Subjective:    Curtis Mcintosh today has no fevers, no emesis,  No chest pain, fatigue and dyspnea persist, reports some dizziness as well  Assessment  & Plan :    Active Problems:   Melena  Brief Summary  78 y.o. male with medical history significant of dementia, hypertension, prostate cancer, CKD III, h/o Afib, aortic insufficiency, sick sinus syndrome status post pacemaker/AICD, history of A. fib, achalasia and dysphagia status post Heller myotomy with fundoplication in 3888  admitted on 04/15/2019 with a hemoglobin of 6.9 and heme +ve stool while on Mobic.  A/p 1)Acute Blood Loss Anemia with Melena --Upper GI bleed in the setting of Mobic use -Work-up reveals iron deficiency and borderline B12 levels.  We will supplement both -GI consult appreciated  - EGD on 04/16/2019 noted as below -Hemoglobin is up to 7.2 from 6.9 after transfusion of 1 unit of packed cells  2) acute on chronic symptomatic anemia due to acute blood loss--- baseline hemoglobin usually around 8 (Records from [facility]) -fatigue and dyspnea persist, reports some dizziness as well -Treat as above #1  3)AKI----acute kidney injury on CKD stage - III, worsening renal function in the setting of acute blood loss-hypovolemia   creatinine on admission=2.38 ,   baseline creatinine = 1.7 to 1.97 ((Records from [facility])   , creatinine is now= 2.1     , renally adjust medications, avoid nephrotoxic agents/dehydration/hypotension ---Hold home lisinopril, hold meloxicam  4)Hypertension -Hold lisinopril due to kidney concerns,   may use IV  Hydralazine 10 mg  Every 4 hours Prn for systolic blood pressure over 160 mmhg  5)History of atrial fibrillation/H/o  sick sinus syndrome status post pacemaker/AICD placement --Records from care everywhere indicates that on 02/16/19 --patient's cardiologist Dr. Birdie Sons at [facility] cardiology in [place] --- discussed risk versus benefit of anticoagulation with patient and daughter --given low atrial fibrillation burden on device interrogation and history of recurrent falls and concerns about bleeding risk patient's daughter [name] and patient along with cardiologist Dr. Birdie Sons decided against Full anticoagulation   -Given concerns about ongoing GI blood loss at this time continue to avoid for anticoagulation  Dementia -Mild, continue home medications    DVT prophylaxis: SCDs Code Status: Full code Family Communication: Discussed with patient and daughter Disposition -pending GI evaluation Consults called: Gastroenterology  Disposition/Need for in-Hospital Stay- patient unable to be discharged at this time due to acute GI bleed requiring transfusion and monitoring for hemodynamic instability -fatigue and dyspnea persist, reports some dizziness as well  Code Status : Full  Family Communication:   (patient is alert, awake and coherent)  daughter   Disposition Plan  : TBD  Procedures--  UGI Series on 04/15/19 -upper GI series showed severe narrowing at the GE junction consistent with history of achalasia.  Significant dilation of the esophagus with probable retained food debris seen in the  distal stomach.  Large amount of administered contrast remained within the esophagus throughout the exam, suggesting significant stenosis.  Persistent rounded filling defect seen involving the gastric cardia, cannot exclude mass.  Endoscopy recommended.  EGD 04/04/19 Massively dilated esophagus with stasis of food. Fixed stricture status post Maloney dilation. altered/abnormal appearing cardia  retroflexed of uncertain significance. Need to rule out pseudo - achalasia. Gastric polyp status post snare polypectomy and hemostasis clipping. I am concerned about the possibility of pseudo - achalasia from external mass-effect. CT needed. If that is found not to be the case,. 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   Consults  :  Gi  DVT Prophylaxis  :   - SCDs (Gi bleed)  Lab Results  Component Value Date   PLT 98 (L) 04/16/2019    Inpatient Medications  Scheduled Meds:  calcitRIOL  0.5 mcg Oral q morning - 10a   cholecalciferol  800 Units Oral q morning - 10a   cyanocobalamin  1,000 mcg Intramuscular Once   famotidine  40 mg Oral Daily   ferrous sulfate  325 mg Oral TID WC   Memantine HCl-Donepezil HCl  1 capsule Oral q morning - 10a   mirtazapine  15 mg Oral QHS   tamsulosin  0.4 mg Oral q morning - 10a   Continuous Infusions: PRN Meds:.acetaminophen **OR** acetaminophen, albuterol, hydrALAZINE, ondansetron **OR** ondansetron (ZOFRAN) IV    Anti-infectives (From admission, onward)   None        Objective:   Vitals:   04/15/19 1649 04/15/19 1930 04/15/19 2000 04/15/19 2056  BP:  (!) 178/98 (!) 202/101 102/64  Pulse:  72 62 71  Resp:  (!) 22 17 20   Temp: 97.6 F (36.4 C)   97.7 F (36.5 C)  TempSrc: Oral   Oral  SpO2:  99% 100% 100%  Weight:      Height:        Wt Readings from Last 3 Encounters:  04/15/19 79.4 kg  04/03/19 80.6 kg     Intake/Output Summary (Last 24 hours) at 04/16/2019 0804 Last data filed at 04/15/2019 2013 Gross per 24 hour  Intake 684.83 ml  Output --  Net 684.83 ml     Physical Exam  Gen:- Awake Alert,  In no apparent distress  HEENT:- .AT, No sclera icterus Neck-Supple Neck,No JVD,.  Lungs-  CTAB , fair symmetrical air movement CV-  S1, S2 normal, regular  Abd-  +ve B.Sounds, Abd Soft, mild epigastric discomfort, Extremity/Skin:- No  edema, pedal pulses present  Psych-affect is appropriate, oriented x3 Neuro-no new focal deficits, no tremors   Data Review:   Micro Results Recent Results (from the past 240 hour(s))  SARS Coronavirus 2 [facility] order, Performed in [facility] hospital lab) Nasopharyngeal Nasopharyngeal Swab     Status: None   Collection Time: 04/15/19  3:40 PM   Specimen: Nasopharyngeal Swab  Result Value Ref Range Status   SARS Coronavirus 2 NEGATIVE NEGATIVE Final    Comment: (NOTE) If result is NEGATIVE SARS-CoV-2 target nucleic acids are NOT DETECTED. The SARS-CoV-2 RNA is generally detectable in upper and lower  respiratory specimens during the acute phase of infection. The lowest  concentration of SARS-CoV-2 viral copies this assay can detect is 250  copies / mL. A negative result does not preclude SARS-CoV-2 infection  and should not be used as the  sole basis for treatment or other  patient management decisions.  A negative result may occur with  improper specimen collection / handling, submission of specimen other  than nasopharyngeal swab, presence of viral mutation(s) within the  areas targeted by this assay, and inadequate number of viral copies  (<250 copies / mL). A negative result must be combined with clinical  observations, patient history, and epidemiological information. If result is POSITIVE SARS-CoV-2 target nucleic acids are DETECTED. The SARS-CoV-2 RNA is generally detectable in upper and lower  respiratory specimens dur ing the acute phase of infection.  Positive  results are indicative of active infection with SARS-CoV-2.  Clinical  correlation with patient history and other diagnostic information is  necessary to determine patient infection status.  Positive results do  not rule out bacterial infection or co-infection with other viruses. If result is PRESUMPTIVE  POSTIVE SARS-CoV-2 nucleic acids MAY BE PRESENT.   A presumptive positive result was obtained on the submitted specimen  and confirmed on repeat testing.  While 2019 novel coronavirus  (SARS-CoV-2) nucleic acids may be present in the submitted sample  additional confirmatory testing may be necessary for epidemiological  and / or clinical management purposes  to differentiate between  SARS-CoV-2 and other Sarbecovirus currently known to infect humans.  If clinically indicated additional testing with an alternate test  methodology [phone]) is advised. The SARS-CoV-2 RNA is generally  detectable in upper and lower respiratory sp ecimens during the acute  phase of infection. The expected result is Negative. Fact Sheet for Patients:  StrictlyIdeas.no Fact Sheet for Healthcare Providers: BankingDealers.co.za This test is not yet approved or cleared by the [place] FDA and has been authorized for detection and/or diagnosis of SARS-CoV-2 by FDA under an Emergency Use Authorization (EUA).  This EUA will remain in effect (meaning this test can be used) for the duration of the COVID-19 declaration under Section 564(b)(1) of the Act, 21 U.S.C. section 360bbb-3(b)(1), unless the authorization is terminated or revoked sooner. Performed at [facility], [address] [place], [place] [address]     Radiology Reports Dg Paulene Floor Double Cm (hd Ba)  Result Date: 04/15/2019 CLINICAL DATA:  Achalasia, dysphagia. EXAM: UPPER GI SERIES WITH KUB TECHNIQUE: After obtaining a scout radiograph a routine upper GI series was performed using thin and high density barium. FLUOROSCOPY TIME:  Radiation Exposure Index (if provided by the fluoroscopic device): 331.1 mGy. COMPARISON:  None. FINDINGS: Severe narrowing of the gastroesophageal junction is noted consistent with a history of achalasia. There is dilatation of the esophagus, particularly involving its distal  portion. No definite mass is noted. However, there does appear to be some retained food debris within the distal esophagus. A large amount of the administered barium remained within the esophagus throughout the exam. Given the fact that there was a significant amount of retained barium within the distal esophagus, as well as the presence of the achalasia and food debris, barium tablet was not administered as it certainly would not have passed into the stomach. No definite hiatal hernia is noted. Visualization of the stomach was limited due to the achalasia. Persistent rounded defect is seen involving the gastric cardia which may represent external compression, but possible mass cannot be excluded. Contrast flowed slowly from the stomach into the duodenum. The duodenal bulb and sweep are unremarkable. Status post cholecystectomy. IMPRESSION: Severe narrowing of the gastroesophageal junction is noted consistent with the given history of achalasia. There is significant dilatation of the esophagus with probable retained  food debris seen in the distal stomach. Large amount of administered contrast remained within the esophagus throughout the exam, suggesting significant stenosis. Persistent rounded filling defect is seen involving the gastric cardia which may simply represent external compression, but possible mass cannot be excluded. Endoscopy is recommended for further evaluation. Electronically Signed   By: Marijo Conception M.D.   On: 04/15/2019 11:41     CBC Recent Labs  Lab 04/15/19 0907 04/15/19 1341 04/16/19 0358  WBC 3.6* 4.4 4.1  HGB 6.9* 6.9* 7.2*  HCT 24.5* 24.5* 24.4*  PLT 109* 107* 98*  MCV 72.5* 72.5* 72.0*  MCH 20.4* 20.4* 21.2*  MCHC 28.2* 28.2* 29.5*  RDW 20.1* 20.1* 19.9*  LYMPHSABS 0.8  --   --   MONOABS 0.4  --   --   EOSABS 0.2  --   --   BASOSABS 0.0  --   --     Chemistries  Recent Labs  Lab 04/15/19 0907 04/15/19 1341 04/16/19 0358  NA 145 144 145  K 4.0 3.6 3.6  CL  109 111 113*  CO2 29 28 27   GLUCOSE 95 105* 81  BUN 22 22 22   CREATININE 2.36* 2.38* 2.19*  CALCIUM 8.8* 8.7* 8.7*  AST 12* 11* 11*  ALT 11 11 10   ALKPHOS 40 41 45  BILITOT 0.4 0.4 0.8   ------------------------------------------------------------------------------------------------------------------ No results for input(s): CHOL, HDL, LDLCALC, TRIG, CHOLHDL, LDLDIRECT in the last 72 hours.  No results found for: HGBA1C ------------------------------------------------------------------------------------------------------------------ No results for input(s): TSH, T4TOTAL, T3FREE, THYROIDAB in the last 72 hours.  Invalid input(s): FREET3 ------------------------------------------------------------------------------------------------------------------ Recent Labs    04/15/19 0908 04/15/19 1340 04/15/19 1341  VITAMINB12  --   --  210  FOLATE  --  9.9  --   FERRITIN 4*  --  4*  TIBC 382  --  402  IRON 22*  --  25*  RETICCTPCT  --  1.1  --     Coagulation profile No results for input(s): INR, PROTIME in the last 168 hours.  No results for input(s): DDIMER in the last 72 hours.  Cardiac Enzymes No results for input(s): CKMB, TROPONINI, MYOGLOBIN in the last 168 hours.  Invalid input(s): CK ------------------------------------------------------------------------------------------------------------------ No results found for: BNP   Roxan Hockey M.D on 04/16/2019 at 8:04 AM  Go to [email or web address] - for contact info  [facility] - Office  [phone]

## 2019-04-16 NOTE — Progress Notes (Signed)
Spoke to nursing, [place]. Patient has been consented for EGD. They were able to get in touch with daughter, [name]. I requested BP to be checked and call with significant results.   Laureen Ochs. Bernarda Caffey [facility] [phone] 8/13/202012:00 PM

## 2019-04-16 NOTE — Anesthesia Procedure Notes (Signed)
Procedure Name: General with mask airway Date/Time: 04/16/2019 1:03 PM Performed by: Adams, Amy A, CRNA Pre-anesthesia Checklist: Timeout performed, Patient being monitored, Suction available, Emergency Drugs available and Patient identified Laryngoscope Size: Mac and 3 Grade View: Grade I Tube type: Oral Tube size: 7.0 mm Number of attempts: 1 Airway Equipment and Method: Stylet Placement Confirmation: ETT inserted through vocal cords under direct vision,  positive ETCO2 and breath sounds checked- equal and bilateral Secured at: 21 cm Tube secured with: Tape Dental Injury: Teeth and Oropharynx as per pre-operative assessment

## 2019-04-16 NOTE — Addendum Note (Signed)
Addendum  created 04/16/19 1413 by Mickel Baas, CRNA   Charge Capture section accepted

## 2019-04-17 ENCOUNTER — Observation Stay (HOSPITAL_COMMUNITY): Payer: Medicare HMO

## 2019-04-17 DIAGNOSIS — E861 Hypovolemia: Secondary | ICD-10-CM | POA: Diagnosis present

## 2019-04-17 DIAGNOSIS — K228 Other specified diseases of esophagus: Secondary | ICD-10-CM | POA: Diagnosis present

## 2019-04-17 DIAGNOSIS — R296 Repeated falls: Secondary | ICD-10-CM | POA: Diagnosis present

## 2019-04-17 DIAGNOSIS — R933 Abnormal findings on diagnostic imaging of other parts of digestive tract: Secondary | ICD-10-CM | POA: Diagnosis not present

## 2019-04-17 DIAGNOSIS — Z20828 Contact with and (suspected) exposure to other viral communicable diseases: Secondary | ICD-10-CM | POA: Diagnosis present

## 2019-04-17 DIAGNOSIS — Z8546 Personal history of malignant neoplasm of prostate: Secondary | ICD-10-CM | POA: Diagnosis not present

## 2019-04-17 DIAGNOSIS — I351 Nonrheumatic aortic (valve) insufficiency: Secondary | ICD-10-CM | POA: Diagnosis present

## 2019-04-17 DIAGNOSIS — I495 Sick sinus syndrome: Secondary | ICD-10-CM | POA: Diagnosis present

## 2019-04-17 DIAGNOSIS — K220 Achalasia of cardia: Secondary | ICD-10-CM | POA: Diagnosis present

## 2019-04-17 DIAGNOSIS — F0280 Dementia in other diseases classified elsewhere without behavioral disturbance: Secondary | ICD-10-CM | POA: Diagnosis present

## 2019-04-17 DIAGNOSIS — D62 Acute posthemorrhagic anemia: Secondary | ICD-10-CM | POA: Diagnosis present

## 2019-04-17 DIAGNOSIS — R1314 Dysphagia, pharyngoesophageal phase: Secondary | ICD-10-CM | POA: Diagnosis present

## 2019-04-17 DIAGNOSIS — Z79899 Other long term (current) drug therapy: Secondary | ICD-10-CM | POA: Diagnosis not present

## 2019-04-17 DIAGNOSIS — K921 Melena: Secondary | ICD-10-CM | POA: Diagnosis present

## 2019-04-17 DIAGNOSIS — N179 Acute kidney failure, unspecified: Secondary | ICD-10-CM | POA: Diagnosis present

## 2019-04-17 DIAGNOSIS — R1013 Epigastric pain: Secondary | ICD-10-CM | POA: Diagnosis not present

## 2019-04-17 DIAGNOSIS — G309 Alzheimer's disease, unspecified: Secondary | ICD-10-CM | POA: Diagnosis present

## 2019-04-17 DIAGNOSIS — I4891 Unspecified atrial fibrillation: Secondary | ICD-10-CM | POA: Diagnosis present

## 2019-04-17 DIAGNOSIS — Y929 Unspecified place or not applicable: Secondary | ICD-10-CM | POA: Diagnosis not present

## 2019-04-17 DIAGNOSIS — R1310 Dysphagia, unspecified: Secondary | ICD-10-CM | POA: Diagnosis not present

## 2019-04-17 DIAGNOSIS — K317 Polyp of stomach and duodenum: Secondary | ICD-10-CM | POA: Diagnosis present

## 2019-04-17 DIAGNOSIS — T39395A Adverse effect of other nonsteroidal anti-inflammatory drugs [NSAID], initial encounter: Secondary | ICD-10-CM | POA: Diagnosis present

## 2019-04-17 DIAGNOSIS — K922 Gastrointestinal hemorrhage, unspecified: Secondary | ICD-10-CM | POA: Diagnosis not present

## 2019-04-17 DIAGNOSIS — N184 Chronic kidney disease, stage 4 (severe): Secondary | ICD-10-CM | POA: Diagnosis present

## 2019-04-17 DIAGNOSIS — D631 Anemia in chronic kidney disease: Secondary | ICD-10-CM | POA: Diagnosis present

## 2019-04-17 DIAGNOSIS — R109 Unspecified abdominal pain: Secondary | ICD-10-CM

## 2019-04-17 DIAGNOSIS — Z9049 Acquired absence of other specified parts of digestive tract: Secondary | ICD-10-CM | POA: Diagnosis not present

## 2019-04-17 DIAGNOSIS — D649 Anemia, unspecified: Secondary | ICD-10-CM | POA: Diagnosis present

## 2019-04-17 DIAGNOSIS — Z95810 Presence of automatic (implantable) cardiac defibrillator: Secondary | ICD-10-CM | POA: Diagnosis not present

## 2019-04-17 DIAGNOSIS — Z791 Long term (current) use of non-steroidal anti-inflammatories (NSAID): Secondary | ICD-10-CM | POA: Diagnosis not present

## 2019-04-17 DIAGNOSIS — I129 Hypertensive chronic kidney disease with stage 1 through stage 4 chronic kidney disease, or unspecified chronic kidney disease: Secondary | ICD-10-CM | POA: Diagnosis present

## 2019-04-17 LAB — BASIC METABOLIC PANEL
Anion gap: 8 (ref 5–15)
BUN: 21 mg/dL (ref 8–23)
CO2: 27 mmol/L (ref 22–32)
Calcium: 8.4 mg/dL — ABNORMAL LOW (ref 8.9–10.3)
Chloride: 107 mmol/L (ref 98–111)
Creatinine, Ser: 2.26 mg/dL — ABNORMAL HIGH (ref 0.61–1.24)
GFR calc Af Amer: 31 mL/min — ABNORMAL LOW (ref >60)
GFR calc non Af Amer: 27 mL/min — ABNORMAL LOW (ref >60)
Glucose, Bld: 82 mg/dL (ref 70–99)
Potassium: 3.4 mmol/L — ABNORMAL LOW (ref 3.5–5.1)
Sodium: 142 mmol/L (ref 135–145)

## 2019-04-17 LAB — CBC
HCT: 24.3 % — ABNORMAL LOW (ref 39.0–52.0)
Hemoglobin: 7.2 g/dL — ABNORMAL LOW (ref 13.0–17.0)
MCH: 21.7 pg — ABNORMAL LOW (ref 26.0–34.0)
MCHC: 29.6 g/dL — ABNORMAL LOW (ref 30.0–36.0)
MCV: 73.2 fL — ABNORMAL LOW (ref 80.0–100.0)
Platelets: 95 10*3/uL — ABNORMAL LOW (ref 150–400)
RBC: 3.32 MIL/uL — ABNORMAL LOW (ref 4.22–5.81)
RDW: 20.4 % — ABNORMAL HIGH (ref 11.5–15.5)
WBC: 4.3 10*3/uL (ref 4.0–10.5)
nRBC: 0 % (ref 0.0–0.2)

## 2019-04-17 MED ORDER — BOOST / RESOURCE BREEZE PO LIQD CUSTOM
1.0000 | Freq: Three times a day (TID) | ORAL | Status: DC
  Administered 2019-04-17 – 2019-04-19 (×6): 1 via ORAL

## 2019-04-17 MED ORDER — BISACODYL 5 MG PO TBEC
5.0000 mg | DELAYED_RELEASE_TABLET | ORAL | Status: AC
  Administered 2019-04-17 (×2): 5 mg via ORAL
  Filled 2019-04-17 (×2): qty 1

## 2019-04-17 MED ORDER — POLYETHYLENE GLYCOL 3350 17 G PO PACK
17.0000 g | PACK | ORAL | Status: AC
  Administered 2019-04-17 (×3): 17 g via ORAL
  Filled 2019-04-17 (×2): qty 1

## 2019-04-17 NOTE — Progress Notes (Signed)
Subjective: Feeling well today. Had a bowel movement yesterday with no blood, no black stool. Tolerating liquids fine. Denies N/V, regurgitation, abdominal pain. No other GI complaints.  Objective: Vital signs in last 24 hours: Temp:  [97.9 F (36.6 C)-98.9 F (37.2 C)] 98.7 F (37.1 C) (08/14 0413) Pulse Rate:  [60-72] 63 (08/14 0413) Resp:  [16-20] 18 (08/14 0413) BP: (159-191)/(83-111) 177/103 (08/14 0413) SpO2:  [96 %-100 %] 98 % (08/14 0413) Last BM Date: 04/15/19 General:   Alert and oriented, pleasant Head:  Normocephalic and atraumatic. Eyes:  No icterus, sclera clear. Conjuctiva pink.  Heart:  S1, S2 present, no murmurs noted.  Lungs: Clear to auscultation bilaterally, without wheezing, rales, or rhonchi.  Abdomen:  Bowel sounds present, soft, non-tender, non-distended. No HSM or hernias noted. No rebound or guarding. No masses appreciated  Msk:  Symmetrical without gross deformities. Pulses:  Normal pulses noted. Extremities:  Without clubbing or edema. Neurologic:  Alert and  oriented x4;  grossly normal neurologically. Psych:  Alert and cooperative. Normal mood and affect.  Intake/Output from previous day: 08/13 0701 - 08/14 0700 In: 1180 [P.O.:480; I.V.:700] Out: 1050 [Urine:1050] Intake/Output this shift: No intake/output data recorded.  Lab Results: Recent Labs    04/15/19 1341 04/16/19 0358 04/17/19 0520  WBC 4.4 4.1 4.3  HGB 6.9* 7.2* 7.2*  HCT 24.5* 24.4* 24.3*  PLT 107* 98* 95*   BMET Recent Labs    04/15/19 1341 04/16/19 0358 04/17/19 0520  NA 144 145 142  K 3.6 3.6 3.4*  CL 111 113* 107  CO2 28 27 27   GLUCOSE 105* 81 82  BUN 22 22 21   CREATININE 2.38* 2.19* 2.26*  CALCIUM 8.7* 8.7* 8.4*   LFT Recent Labs    04/15/19 0907 04/15/19 1341 04/16/19 0358  PROT 5.6* 5.7* 4.9*  ALBUMIN 3.3* 3.4* 3.0*  AST 12* 11* 11*  ALT 11 11 10   ALKPHOS 40 41 45  BILITOT 0.4 0.4 0.8   PT/INR No results for input(s): LABPROT, INR in the  last 72 hours. Hepatitis Panel No results for input(s): HEPBSAG, HCVAB, HEPAIGM, HEPBIGM in the last 72 hours.   Studies/Results: Dg Ugi W Double Cm (hd Ba)  Result Date: 04/15/2019 CLINICAL DATA:  Achalasia, dysphagia. EXAM: UPPER GI SERIES WITH KUB TECHNIQUE: After obtaining a scout radiograph a routine upper GI series was performed using thin and high density barium. FLUOROSCOPY TIME:  Radiation Exposure Index (if provided by the fluoroscopic device): 331.1 mGy. COMPARISON:  None. FINDINGS: Severe narrowing of the gastroesophageal junction is noted consistent with a history of achalasia. There is dilatation of the esophagus, particularly involving its distal portion. No definite mass is noted. However, there does appear to be some retained food debris within the distal esophagus. A large amount of the administered barium remained within the esophagus throughout the exam. Given the fact that there was a significant amount of retained barium within the distal esophagus, as well as the presence of the achalasia and food debris, barium tablet was not administered as it certainly would not have passed into the stomach. No definite hiatal hernia is noted. Visualization of the stomach was limited due to the achalasia. Persistent rounded defect is seen involving the gastric cardia which may represent external compression, but possible mass cannot be excluded. Contrast flowed slowly from the stomach into the duodenum. The duodenal bulb and sweep are unremarkable. Status post cholecystectomy. IMPRESSION: Severe narrowing of the gastroesophageal junction is noted consistent with the given history of  achalasia. There is significant dilatation of the esophagus with probable retained food debris seen in the distal stomach. Large amount of administered contrast remained within the esophagus throughout the exam, suggesting significant stenosis. Persistent rounded filling defect is seen involving the gastric cardia which  may simply represent external compression, but possible mass cannot be excluded. Endoscopy is recommended for further evaluation. Electronically Signed   By: Marijo Conception M.D.   On: 04/15/2019 11:41    Assessment: 78 y/o male presenting to ED with profound IDA discovered on outpatient labs and because of reported melena for 2-3 weeks he was advised to go to the ED for evaluation.  He has a history of achalasia status post Heller myotomy with fundoplication in 9390, has required esophageal dilation since that time (2016 last EGD).  Has been having significant dysphagia with markedly abnormal upper GI series on admission.  Significant dilatation of esophagus with retained food debris within the distal esophagus and a large amount of administered barium remained throughout the exam suggesting significant stenosis.  Persistent rounded defect seen in the gastric cardia which could represent external compression but mass cannot be excluded.  Appears he has had further decline in Hgb over the past six months. Labs from January 2020 through [facility] showed hemoglobin of 7.9, MCV 72.3, platelets 151,000, white blood cell count 3900, iron low at 32.  Notably platelet counts have been low in 2018, 98,000.  EGD completed yesterday found massively dilated esophagus with retained food debris/stasis and closed the GE junction that did admit the scope easily.  No identified fixed fibrotic stricture ring or web or evidence of neoplasm in the esophageal side.  There was noted retained barium contrast and food debris in the stomach.  A 1 cm hemorrhagic pedunculated polyp was found status post resection and hemostasis clip.  Evidence of prior surgery at the GE junction with submucosal mass-effect at the cardia of uncertain significance but no obvious tumor.  The pylorus was patent and easily traversed, duodenum appeared normal.  A 56 French Maloney dilator was passed to full insertion with slight resistance.   Recommended rule out pseudo-achalasia due to external mass-effect and recommended CT evaluation.  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  May need further manometry as well.  A CT of the abdomen and chest with contrast was ordered.  Phone call received from CT tech indicating scalp found showed retained barium in the stomach and would need to delay CT imaging until tomorrow.  Today he states he is tolerating his clear liquid diet well.  Awaiting CT scan.  Apparently a flat plate abdomen is been ordered to check for further barium in the stomach.  Denies any significant GI symptoms.  Specifically, no hematochezia or melena on his last bowel movement.  Exam essentially normal.  His hemoglobin is stable today at 7.2.  Plan: 1. Continue clear liquids for now 2. We will follow for CT results which will guide further recommendations 3. Depending on findings he may require repeat myotomy 4. Supportive measures 5. Plan for outpatient colonoscopy due to anemia   Thank you for allowing [place] to participate in the care of [place], DNP, AGNP-C Adult & Gerontological Nurse Practitioner [facility]    LOS: 0 days    04/17/2019, 8:09 AM

## 2019-04-17 NOTE — Care Management Important Message (Signed)
Important Message  Patient Details  Name: Curtis Mcintosh MRN: [id number] Date of Birth: [birth year]   Medicare Important Message Given:  Yes     Ihor Gully, LCSW 04/17/2019, 2:57 PM

## 2019-04-17 NOTE — Progress Notes (Signed)
Patient Demographics:    Curtis Mcintosh, is a 78 y.o. male, DOB - November 22, 1940, [id number]  Admit date - 04/15/2019   Admitting Physician Costin Karlyne Greenspan, MD  Outpatient Primary MD for the patient is Monico Blitz, MD  LOS - 0   Chief Complaint  Patient presents with   Abnormal Lab        Subjective:    Curtis Mcintosh today has no fevers, no emesis,  No chest pain, fatigue and dyspnea persist, reports some dizziness as well -Daughter Curtis Mcintosh at bedside, barely tolerating clear liquids -Significant dysphagia and odynophagia persist  Assessment  & Plan :    Active Problems:   Melena   Gastrointestinal hemorrhage   Iron deficiency anemia due to chronic blood loss   Esophageal dysphagia   Abnormal UGI series   Abdominal pain  Brief Summary  78 y.o. male with medical history significant of dementia, hypertension, prostate cancer, CKD III, h/o Afib, aortic insufficiency, sick sinus syndrome status post pacemaker/AICD, history of A. fib, achalasia and dysphagia status post Heller myotomy with fundoplication in 6578  admitted on 04/15/2019 with a hemoglobin of 6.9 and heme +ve stool while on Mobic. -Barely tolerating clear liquids  A/p 1)Acute Blood Loss Anemia with Melena --Upper GI bleed in the setting of Mobic use -Work-up reveals iron deficiency and borderline B12 levels.  We will supplement both -GI consult appreciated  - EGD on 04/16/2019 noted as below -Hemoglobin is up to 7.2 from 6.9 after transfusion of 1 unit of packed cells  2) dysphagia/achalasia--- swallowing difficulties persist, barely able to tolerate clear liquids, -Contrast material from upper GI series with barium from 04/14/2022 present on abdominal x-ray from 04/17/2019 -Awaiting passage of contrast material in order to be able to do CT chest, abdomen and pelvis--to rule out  possibility of pseudo - achalasia from external  mass-effect. --If no extrinsic etiology for esophageal stenosis patient may need repeat myotomy for achalasia (previously had Heller myotomy in 2012)   3)AKI----acute kidney injury on CKD stage - III, worsening renal function in the setting of acute blood loss-hypovolemia   creatinine on admission=2.38 ,   baseline creatinine = 1.7 to 1.97 ((Records from [facility])   , creatinine is now= 2.2     , renally adjust medications, avoid nephrotoxic agents/dehydration/hypotension ---Hold home lisinopril, hold meloxicam  4)Hypertension -Hold lisinopril due to kidney concerns,   may use IV Hydralazine 10 mg  Every 4 hours Prn for systolic blood pressure over 160 mmhg  5)History of atrial fibrillation/H/o  sick sinus syndrome status post pacemaker/AICD placement --Records from care everywhere indicates that on 02/16/19 --patient's cardiologist Dr. Birdie Sons at [facility] cardiology in [place] --- discussed risk versus benefit of anticoagulation with patient and daughter --given Mcintosh atrial fibrillation burden on device interrogation and history of recurrent falls and concerns about bleeding risk patient's daughter Curtis Mcintosh and patient along with cardiologist Dr. Birdie Sons decided against Full anticoagulation   -Given concerns about ongoing GI blood loss at this time continue to avoid for anticoagulation  6)acute on chronic symptomatic anemia due to acute blood loss--- baseline hemoglobin usually around 8 (Records from [facility]) -fatigue and dyspnea persist, reports some dizziness as well -Treat as above #1 -Hemoglobin 7.2, consider additional transfusion  Dementia -Mild, continue home medications    DVT prophylaxis: SCDs Code Status: Full code Family Communication: Discussed with patient and daughter Disposition -pending GI evaluation Consults called: Gastroenterology  Disposition/Need for in-Hospital Stay- patient unable to be discharged at this time due to acute GI bleed  requiring transfusion and monitoring for hemodynamic instability -fatigue and dyspnea persist, reports some dizziness as well -No reliable way to feed patient at this time until GI work-up is completed  Code Status : Full  Family Communication:   (patient is alert, awake and coherent)  daughter Curtis Mcintosh   Disposition Plan  : TBD  Procedures--  UGI Series on 04/15/19 -upper GI series showed severe narrowing at the GE junction consistent with history of achalasia.  Significant dilation of the esophagus with probable retained food debris seen in the distal stomach.  Large amount of administered contrast remained within the esophagus throughout the exam, suggesting significant stenosis.  Persistent rounded filling defect seen involving the gastric cardia, cannot exclude mass.  Endoscopy recommended.  EGD 04/16/19 Massively dilated esophagus with stasis of food. Fixed stricture status post Maloney dilation. altered/abnormal appearing cardia retroflexed of uncertain significance. Need to rule out pseudo - achalasia. Gastric polyp status post snare polypectomy and hemostasis clipping. I am concerned about the possibility of pseudo - achalasia from external mass-effect. CT needed. If that is found not to be the case,. 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   Consults  :  Gi  DVT Prophylaxis  :   - SCDs (Gi bleed)  Lab Results  Component Value Date   PLT 95 (L) 04/17/2019    Inpatient Medications  Scheduled Meds:  calcitRIOL  0.5 mcg Oral q morning - 10a   cholecalciferol  800 Units Oral q morning - 10a   cyanocobalamin  1,000 mcg Intramuscular Once   memantine  7 mg Oral Daily   Or   donepezil  10 mg Oral Daily   famotidine  20 mg Oral Daily   ferrous sulfate  325 mg Oral TID WC   mirtazapine  15 mg  Oral QHS   tamsulosin  0.4 mg Oral q morning - 10a   Continuous Infusions: PRN Meds:.acetaminophen **OR** acetaminophen, albuterol, hydrALAZINE, iohexol, ondansetron **OR** ondansetron (ZOFRAN) IV    Anti-infectives (From admission, onward)   None        Objective:   Vitals:   04/16/19 2148 04/16/19 2150 04/17/19 0413 04/17/19 1244  BP: (!) 180/104 (!) 165/101 (!) 177/103 106/73  Pulse: 64 62 63 60  Resp: 20  18 18   Temp: 98.2 F (36.8 C)  98.7 F (37.1 C) 97.7 F (36.5 C)  TempSrc: Oral  Oral Oral  SpO2: 100% 100% 98% 100%  Weight:      Height:        Wt Readings from Last 3 Encounters:  04/15/19 79.4 kg  04/03/19 80.6 kg     Intake/Output Summary (Last 24 hours) at 04/17/2019 1654 Last data filed at 04/16/2019 2100 Gross per 24 hour  Intake 480 ml  Output 750 ml  Net -270 ml     Physical Exam  Gen:- Awake Alert,  In no apparent distress  HEENT:- [place].AT, No sclera icterus Neck-Supple Neck,No JVD,.  Lungs-  CTAB , fair symmetrical air movement CV- S1, S2 normal, regular ,  pacemaker/AICD Abd-  +ve B.Sounds, Abd Soft, mild epigastric  discomfort, Extremity/Skin:- No  edema, pedal pulses present  Psych-affect is appropriate, oriented x3 Neuro-no new focal deficits, no tremors   Data Review:   Micro Results Recent Results (from the past 240 hour(s))  SARS Coronavirus 2 [facility] order, Performed in [facility] hospital lab) Nasopharyngeal Nasopharyngeal Swab     Status: None   Collection Time: 04/15/19  3:40 PM   Specimen: Nasopharyngeal Swab  Result Value Ref Range Status   SARS Coronavirus 2 NEGATIVE NEGATIVE Final    Comment: (NOTE) If result is NEGATIVE SARS-CoV-2 target nucleic acids are NOT DETECTED. The SARS-CoV-2 RNA is generally detectable in upper and lower  respiratory specimens during the acute phase of infection. The lowest  concentration of SARS-CoV-2 viral copies this assay can detect is 250  copies / mL. A negative result does not  preclude SARS-CoV-2 infection  and should not be used as the sole basis for treatment or other  patient management decisions.  A negative result may occur with  improper specimen collection / handling, submission of specimen other  than nasopharyngeal swab, presence of viral mutation(s) within the  areas targeted by this assay, and inadequate number of viral copies  (<250 copies / mL). A negative result must be combined with clinical  observations, patient history, and epidemiological information. If result is POSITIVE SARS-CoV-2 target nucleic acids are DETECTED. The SARS-CoV-2 RNA is generally detectable in upper and lower  respiratory specimens dur ing the acute phase of infection.  Positive  results are indicative of active infection with SARS-CoV-2.  Clinical  correlation with patient history and other diagnostic information is  necessary to determine patient infection status.  Positive results do  not rule out bacterial infection or co-infection with other viruses. If result is PRESUMPTIVE POSTIVE SARS-CoV-2 nucleic acids MAY BE PRESENT.   A presumptive positive result was obtained on the submitted specimen  and confirmed on repeat testing.  While 2019 novel coronavirus  (SARS-CoV-2) nucleic acids may be present in the submitted sample  additional confirmatory testing may be necessary for epidemiological  and / or clinical management purposes  to differentiate between  SARS-CoV-2 and other Sarbecovirus currently known to infect humans.  If clinically indicated additional testing with an alternate test  methodology [phone]) is advised. The SARS-CoV-2 RNA is generally  detectable in upper and lower respiratory sp ecimens during the acute  phase of infection. The expected result is Negative. Fact Sheet for Patients:  StrictlyIdeas.no Fact Sheet for Healthcare Providers: BankingDealers.co.za This test is not yet approved or cleared by  the [place] FDA and has been authorized for detection and/or diagnosis of SARS-CoV-2 by FDA under an Emergency Use Authorization (EUA).  This EUA will remain in effect (meaning this test can be used) for the duration of the COVID-19 declaration under Section 564(b)(1) of the Act, 21 U.S.C. section 360bbb-3(b)(1), unless the authorization is terminated or revoked sooner. Performed at [facility], [address] [place], [place] [address]     Radiology Reports Dg Abd 1 View  Result Date: 04/17/2019 CLINICAL DATA:  Abdominal pain EXAM: ABDOMEN - 1 VIEW COMPARISON:  Upper GI 04/15/2019 FINDINGS: No dilated loops of bowel identified. Enteric contrast material from upper GI has progressed to the colon beyond the level of the splenic flexure. No dilated loops of small bowel identified. IMPRESSION: 1. Persistent contrast material identified within the colon predominantly involving the transverse colon and splenic flexure. 2. No dilated small bowel loops. Electronically Signed   By: Queen Slough.D.  On: 04/17/2019 09:16   Dg Duanne Limerick W Double Cm (hd Ba)  Result Date: 04/15/2019 CLINICAL DATA:  Achalasia, dysphagia. EXAM: UPPER GI SERIES WITH KUB TECHNIQUE: After obtaining a scout radiograph a routine upper GI series was performed using thin and high density barium. FLUOROSCOPY TIME:  Radiation Exposure Index (if provided by the fluoroscopic device): 331.1 mGy. COMPARISON:  None. FINDINGS: Severe narrowing of the gastroesophageal junction is noted consistent with a history of achalasia. There is dilatation of the esophagus, particularly involving its distal portion. No definite mass is noted. However, there does appear to be some retained food debris within the distal esophagus. A large amount of the administered barium remained within the esophagus throughout the exam. Given the fact that there was a significant amount of retained barium within the distal esophagus, as well as the presence of the  achalasia and food debris, barium tablet was not administered as it certainly would not have passed into the stomach. No definite hiatal hernia is noted. Visualization of the stomach was limited due to the achalasia. Persistent rounded defect is seen involving the gastric cardia which may represent external compression, but possible mass cannot be excluded. Contrast flowed slowly from the stomach into the duodenum. The duodenal bulb and sweep are unremarkable. Status post cholecystectomy. IMPRESSION: Severe narrowing of the gastroesophageal junction is noted consistent with the given history of achalasia. There is significant dilatation of the esophagus with probable retained food debris seen in the distal stomach. Large amount of administered contrast remained within the esophagus throughout the exam, suggesting significant stenosis. Persistent rounded filling defect is seen involving the gastric cardia which may simply represent external compression, but possible mass cannot be excluded. Endoscopy is recommended for further evaluation. Electronically Signed   By: Marijo Conception M.D.   On: 04/15/2019 11:41     CBC Recent Labs  Lab 04/15/19 0907 04/15/19 1341 04/16/19 0358 04/17/19 0520  WBC 3.6* 4.4 4.1 4.3  HGB 6.9* 6.9* 7.2* 7.2*  HCT 24.5* 24.5* 24.4* 24.3*  PLT 109* 107* 98* 95*  MCV 72.5* 72.5* 72.0* 73.2*  MCH 20.4* 20.4* 21.2* 21.7*  MCHC 28.2* 28.2* 29.5* 29.6*  RDW 20.1* 20.1* 19.9* 20.4*  LYMPHSABS 0.8  --   --   --   MONOABS 0.4  --   --   --   EOSABS 0.2  --   --   --   BASOSABS 0.0  --   --   --     Chemistries  Recent Labs  Lab 04/15/19 0907 04/15/19 1341 04/16/19 0358 04/17/19 0520  NA 145 144 145 142  K 4.0 3.6 3.6 3.4*  CL 109 111 113* 107  CO2 29 28 27 27   GLUCOSE 95 105* 81 82  BUN 22 22 22 21   CREATININE 2.36* 2.38* 2.19* 2.26*  CALCIUM 8.8* 8.7* 8.7* 8.4*  AST 12* 11* 11*  --   ALT 11 11 10   --   ALKPHOS 40 41 45  --   BILITOT 0.4 0.4 0.8  --     ------------------------------------------------------------------------------------------------------------------ No results for input(s): CHOL, HDL, LDLCALC, TRIG, CHOLHDL, LDLDIRECT in the last 72 hours.  No results found for: HGBA1C ------------------------------------------------------------------------------------------------------------------ No results for input(s): TSH, T4TOTAL, T3FREE, THYROIDAB in the last 72 hours.  Invalid input(s): FREET3 ------------------------------------------------------------------------------------------------------------------ Recent Labs    04/15/19 0908 04/15/19 1340 04/15/19 1341  VITAMINB12  --   --  210  FOLATE  --  9.9  --   FERRITIN 4*  --  4*  TIBC 382  --  402  IRON 22*  --  25*  RETICCTPCT  --  1.1  --     Coagulation profile No results for input(s): INR, PROTIME in the last 168 hours.  No results for input(s): DDIMER in the last 72 hours.  Cardiac Enzymes No results for input(s): CKMB, TROPONINI, MYOGLOBIN in the last 168 hours.  Invalid input(s): CK ------------------------------------------------------------------------------------------------------------------ No results found for: BNP   Roxan Hockey M.D on 04/17/2019 at 4:54 PM  Go to [email or web address] - for contact info  [facility] - Office  [phone]

## 2019-04-18 ENCOUNTER — Inpatient Hospital Stay (HOSPITAL_COMMUNITY): Payer: Medicare HMO

## 2019-04-18 DIAGNOSIS — R1013 Epigastric pain: Secondary | ICD-10-CM

## 2019-04-18 LAB — CBC
HCT: 27 % — ABNORMAL LOW (ref 39.0–52.0)
Hemoglobin: 7.7 g/dL — ABNORMAL LOW (ref 13.0–17.0)
MCH: 21.3 pg — ABNORMAL LOW (ref 26.0–34.0)
MCHC: 28.5 g/dL — ABNORMAL LOW (ref 30.0–36.0)
MCV: 74.6 fL — ABNORMAL LOW (ref 80.0–100.0)
Platelets: 97 10*3/uL — ABNORMAL LOW (ref 150–400)
RBC: 3.62 MIL/uL — ABNORMAL LOW (ref 4.22–5.81)
RDW: 20.4 % — ABNORMAL HIGH (ref 11.5–15.5)
WBC: 4 10*3/uL (ref 4.0–10.5)
nRBC: 0 % (ref 0.0–0.2)

## 2019-04-18 MED ORDER — POTASSIUM CHLORIDE 20 MEQ PO PACK
40.0000 meq | PACK | Freq: Once | ORAL | Status: AC
  Administered 2019-04-18: 40 meq via ORAL
  Filled 2019-04-18: qty 2

## 2019-04-18 NOTE — Progress Notes (Signed)
Discussed with Dr.Emokpae.  Patient appears to have a significant amount of residual intraluminal barium on plain films.  From a GI standpoint, he could probably be discharged on a full liquid diet.  I will arrange CT to be done middle of next week.  If negative for tumor, we will proceed with getting an esophageal manometry (can be done in [place]).  I suspect that will confirm achalasia.  Then it would be on to GI surgery for consultation at [facility] (I will arrange.

## 2019-04-18 NOTE — Progress Notes (Signed)
Patient appears to be tolerating full liquids.  Awaiting follow-up plain films for clearance for CT. No obvious GI bleeding at this time.  Hemoglobin stable.    Vital signs in last 24 hours: Temp:  [97.3 F (36.3 C)-98.8 F (37.1 C)] 98.8 F (37.1 C) (08/15 0514) Pulse Rate:  [58-60] 60 (08/15 0514) Resp:  [18] 18 (08/15 0514) BP: (106-181)/(73-113) 160/97 (08/15 0514) SpO2:  [98 %-100 %] 98 % (08/15 0514) Last BM Date: 04/17/19 General:   pleasant and cooperative in NAD Abdomen:  Soft, nontender and nondistended.  Normal bowel sounds, without guarding, and without rebound.  No mass or organomegaly. Extremities:  Without clubbing or edema.    Intake/Output from previous day: 08/14 0701 - 08/15 0700 In: 240 [P.O.:240] Out: 250 [Urine:250] Intake/Output this shift: No intake/output data recorded.  Lab Results: Recent Labs    04/15/19 1341 04/16/19 0358 04/17/19 0520  WBC 4.4 4.1 4.3  HGB 6.9* 7.2* 7.2*  HCT 24.5* 24.4* 24.3*  PLT 107* 98* 95*   BMET Recent Labs    04/15/19 1341 04/16/19 0358 04/17/19 0520  NA 144 145 142  K 3.6 3.6 3.4*  CL 111 113* 107  CO2 28 27 27   GLUCOSE 105* 81 82  BUN 22 22 21   CREATININE 2.38* 2.19* 2.26*  CALCIUM 8.7* 8.7* 8.4*   LFT Recent Labs    04/16/19 0358  PROT 4.9*  ALBUMIN 3.0*  AST 11*  ALT 10  ALKPHOS 45  BILITOT 0.8   PT/INR No results for input(s): LABPROT, INR in the last 72 hours. Hepatitis Panel No results for input(s): HEPBSAG, HCVAB, HEPAIGM, HEPBIGM in the last 72 hours. C-Diff No results for input(s): CDIFFTOX in the last 72 hours.  Studies/Results: Dg Abd 1 View  Result Date: 04/17/2019 CLINICAL DATA:  Abdominal pain EXAM: ABDOMEN - 1 VIEW COMPARISON:  Upper GI 04/15/2019 FINDINGS: No dilated loops of bowel identified. Enteric contrast material from upper GI has progressed to the colon beyond the level of the splenic flexure. No dilated loops of small bowel identified. IMPRESSION: 1. Persistent  contrast material identified within the colon predominantly involving the transverse colon and splenic flexure. 2. No dilated small bowel loops. Electronically Signed   By: Kerby Moors M.D.   On: 04/17/2019 09:16   Impression: Pleasant 78 year old gentleman with multiple medical problems.  GI issues including ongoing dysphagia, melena, profound iron deficiency anemia.  Recent EGD findings as well as barium findings entirely consistent with classic, untreated achalasia. "Pseudo-achalasia" less likely but needs to be ruled out with CT  Large duodenal polyp removed; biopsies pending.  Given history of surgically treated achalasia, I suspect LES was not completely transected previously leaving persisting pathologic muscle tone at GE junction.  If no tumor on CT,  I recommend he be referred to tertiary center.  Neck step would be manometry.  If achalasia is again documented,  would need surgical consultation.  However, he would be a very poor surgical candidate. May end up with palliative Botox injections.  Would keep him on a full liquid diet and  upright for 1 hour after ingesting meals.  We will follow-up on the duodenal polyp removed.  As a separate issue, iron deficiency anemia,  may need further GI evaluation at a tertiary referral center.  Further recommendations to follow once CT is available for review.

## 2019-04-18 NOTE — Progress Notes (Signed)
Patient Demographics:    Curtis Mcintosh, is a 78 y.o. male, DOB - 11/08/1940, VQX:[id number]  Admit date - 04/15/2019   Admitting Physician Costin Karlyne Greenspan, MD  Outpatient Primary MD for the patient is Monico Blitz, MD  LOS - 1   Chief Complaint  Patient presents with   Abnormal Lab        Subjective:    Curtis Mcintosh today has no fevers, no emesis,  No chest pain, fatigue and dyspnea persist,  -Patient tolerated clear liquid , awaiting to see if he would tolerate full liquids -  Assessment  & Plan :    Active Problems:   Melena   Gastrointestinal hemorrhage   Iron deficiency anemia due to chronic blood loss   Esophageal dysphagia   Abnormal UGI series   Abdominal pain  Brief Summary  78 y.o. male with medical history significant of dementia, hypertension, prostate cancer, CKD III, h/o Afib, aortic insufficiency, sick sinus syndrome status post pacemaker/AICD, history of A. fib, achalasia and dysphagia status post Heller myotomy with fundoplication in 0034  admitted on 04/15/2019 with a hemoglobin of 6.9 and heme +ve stool while on Mobic. -Able to tolerate clear liquids now, awaiting to see if he would tolerate full liquids prior to DC home  A/p 1)Acute Blood Loss Anemia with Melena --Upper GI bleed in the setting of Mobic use -Work-up reveals iron deficiency and borderline B12 levels.  We will supplement both -GI consult appreciated  - EGD on 04/16/2019 noted as below -Hemoglobin is up to 7.7 from 6.9 after transfusion of 1 unit of packed cells  2) dysphagia/achalasia--- swallowing difficulties persist,  -Tolerating clear liquids, advance to full liquids -Contrast material from upper GI series with barium from 04/14/2022 present on abdominal x-ray from 04/18/2019 -Awaiting passage of contrast material in order to be able to do CT chest, abdomen and pelvis--to rule out  possibility of pseudo -  achalasia from external mass-effect. --If CT chest and abdomen does not show extrinsic etiology for esophageal stenosis patient may need repeat myotomy for achalasia (previously had Heller myotomy in 2012)   3)AKI----acute kidney injury on CKD stage - III, worsening renal function in the setting of acute blood loss-hypovolemia   creatinine on admission=2.38 ,   baseline creatinine = 1.7 to 1.97 ((Records from [facility])   , creatinine is now= 2.2     , renally adjust medications, avoid nephrotoxic agents/dehydration/hypotension ---Hold home lisinopril, hold meloxicam  4)Hypertension -Hold lisinopril due to kidney concerns,   may use IV Hydralazine 10 mg  Every 4 hours Prn for systolic blood pressure over 160 mmhg  5)History of atrial fibrillation/H/o  sick sinus syndrome status post pacemaker/AICD placement --Records from care everywhere indicates that on 02/16/19 --patient's cardiologist Dr. Birdie Sons at [facility] cardiology in [place] --- discussed risk versus benefit of anticoagulation with patient and daughter --given low atrial fibrillation burden on device interrogation and history of recurrent falls and concerns about bleeding risk patient's daughter [name] and patient along with cardiologist Dr. Birdie Sons decided against Full anticoagulation   -Given concerns about ongoing GI blood loss at this time continue to avoid for anticoagulation  6)acute on chronic symptomatic anemia due to acute blood loss--- baseline hemoglobin usually around 8 (Records  from [facility]) -fatigue and dyspnea persist, reports some dizziness as well -Treat as above #1 -Hemoglobin 7.7, consider additional transfusion    Dementia -Mild, continue home medications    DVT prophylaxis: SCDs Code Status: Full code Family Communication: Discussed with patient and daughter Disposition -pending GI evaluation Consults called: Gastroenterology  Disposition/Need for in-Hospital Stay- patient unable to  be discharged at this time due to No reliable way to feed patient at this time - -tolerated clear liquids well started on full liquids if patient tolerates full liquids may be discharged home in a.m. on full liquid diet pending further GI work-up as outpatient  Code Status : Full  Family Communication:   (patient is alert, awake and coherent)  daughter [name]   Disposition Plan  : TBD  Procedures--  UGI Series on 04/15/19 -upper GI series showed severe narrowing at the GE junction consistent with history of achalasia.  Significant dilation of the esophagus with probable retained food debris seen in the distal stomach.  Large amount of administered contrast remained within the esophagus throughout the exam, suggesting significant stenosis.  Persistent rounded filling defect seen involving the gastric cardia, cannot exclude mass.  Endoscopy recommended.  EGD 04/16/19 Massively dilated esophagus with stasis of food. Fixed stricture status post Maloney dilation. altered/abnormal appearing cardia retroflexed of uncertain significance. Need to rule out pseudo - achalasia. Gastric polyp status post snare polypectomy and hemostasis clipping. I am concerned about the possibility of pseudo - achalasia from external mass-effect. CT needed. If that is found not to be the case,. 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   Consults  :  Gi  DVT Prophylaxis  :   - SCDs (Gi bleed)  Lab Results  Component Value Date   PLT 97 (L) 04/18/2019    Inpatient Medications  Scheduled Meds:  calcitRIOL  0.5 mcg Oral q morning - 10a   cholecalciferol  800 Units Oral q morning - 10a   cyanocobalamin  1,000 mcg Intramuscular Once   memantine  7 mg Oral Daily   Or   donepezil  10 mg Oral Daily   famotidine  20 mg Oral Daily    feeding supplement  1 Container Oral TID BM   mirtazapine  15 mg Oral QHS   tamsulosin  0.4 mg Oral q morning - 10a   Continuous Infusions: PRN Meds:.acetaminophen **OR** acetaminophen, albuterol, hydrALAZINE, iohexol, ondansetron **OR** ondansetron (ZOFRAN) IV    Anti-infectives (From admission, onward)   None        Objective:   Vitals:   04/17/19 1244 04/17/19 2112 04/18/19 0514 04/18/19 1305  BP: 106/73 (!) 181/113 (!) 160/97 (!) 161/108  Pulse: 60 (!) 58 60 62  Resp: 18 18 18 16   Temp: 97.7 F (36.5 C) (!) 97.3 F (36.3 C) 98.8 F (37.1 C) 98.6 F (37 C)  TempSrc: Oral Oral Oral Oral  SpO2: 100% 100% 98% 98%  Weight:      Height:        Wt Readings from Last 3 Encounters:  04/15/19 79.4 kg  04/03/19 80.6 kg     Intake/Output Summary (Last 24 hours) at 04/18/2019 1812 Last data filed at 04/18/2019 1300 Gross per 24 hour  Intake 480 ml  Output 250 ml  Net 230 ml     Physical Exam  Gen:- Awake Alert,  In no apparent  distress  HEENT:- [place].AT, No sclera icterus Neck-Supple Neck,No JVD,.  Lungs-  CTAB , fair symmetrical air movement CV- S1, S2 normal, regular ,  pacemaker/AICD Abd-  +ve B.Sounds, Abd Soft, mild epigastric discomfort, Extremity/Skin:- No  edema, pedal pulses present  Psych-affect is appropriate, oriented x3 Neuro-no new focal deficits, no tremors   Data Review:   Micro Results Recent Results (from the past 240 hour(s))  SARS Coronavirus 2 [facility] order, Performed in [facility] hospital lab) Nasopharyngeal Nasopharyngeal Swab     Status: None   Collection Time: 04/15/19  3:40 PM   Specimen: Nasopharyngeal Swab  Result Value Ref Range Status   SARS Coronavirus 2 NEGATIVE NEGATIVE Final    Comment: (NOTE) If result is NEGATIVE SARS-CoV-2 target nucleic acids are NOT DETECTED. The SARS-CoV-2 RNA is generally detectable in upper and lower  respiratory specimens during the acute phase of infection. The lowest  concentration of  SARS-CoV-2 viral copies this assay can detect is 250  copies / mL. A negative result does not preclude SARS-CoV-2 infection  and should not be used as the sole basis for treatment or other  patient management decisions.  A negative result may occur with  improper specimen collection / handling, submission of specimen other  than nasopharyngeal swab, presence of viral mutation(s) within the  areas targeted by this assay, and inadequate number of viral copies  (<250 copies / mL). A negative result must be combined with clinical  observations, patient history, and epidemiological information. If result is POSITIVE SARS-CoV-2 target nucleic acids are DETECTED. The SARS-CoV-2 RNA is generally detectable in upper and lower  respiratory specimens dur ing the acute phase of infection.  Positive  results are indicative of active infection with SARS-CoV-2.  Clinical  correlation with patient history and other diagnostic information is  necessary to determine patient infection status.  Positive results do  not rule out bacterial infection or co-infection with other viruses. If result is PRESUMPTIVE POSTIVE SARS-CoV-2 nucleic acids MAY BE PRESENT.   A presumptive positive result was obtained on the submitted specimen  and confirmed on repeat testing.  While 2019 novel coronavirus  (SARS-CoV-2) nucleic acids may be present in the submitted sample  additional confirmatory testing may be necessary for epidemiological  and / or clinical management purposes  to differentiate between  SARS-CoV-2 and other Sarbecovirus currently known to infect humans.  If clinically indicated additional testing with an alternate test  methodology [phone]) is advised. The SARS-CoV-2 RNA is generally  detectable in upper and lower respiratory sp ecimens during the acute  phase of infection. The expected result is Negative. Fact Sheet for Patients:  StrictlyIdeas.no Fact Sheet for Healthcare  Providers: BankingDealers.co.za This test is not yet approved or cleared by the [place] FDA and has been authorized for detection and/or diagnosis of SARS-CoV-2 by FDA under an Emergency Use Authorization (EUA).  This EUA will remain in effect (meaning this test can be used) for the duration of the COVID-19 declaration under Section 564(b)(1) of the Act, 21 U.S.C. section 360bbb-3(b)(1), unless the authorization is terminated or revoked sooner. Performed at [facility], [address], [place], [place] [address]     Radiology Reports Dg Abd 1 View  Result Date: 04/17/2019 CLINICAL DATA:  Abdominal pain EXAM: ABDOMEN - 1 VIEW COMPARISON:  Upper GI 04/15/2019 FINDINGS: No dilated loops of bowel identified. Enteric contrast material from upper GI has progressed to the colon beyond the level of the splenic flexure. No dilated loops of small  bowel identified. IMPRESSION: 1. Persistent contrast material identified within the colon predominantly involving the transverse colon and splenic flexure. 2. No dilated small bowel loops. Electronically Signed   By: Kerby Moors M.D.   On: 04/17/2019 09:16   Dg Abd Acute 2+v W 1v Chest  Result Date: 04/18/2019 CLINICAL DATA:  78 year old male with abdominal pain EXAM: DG ABDOMEN ACUTE W/ 1V CHEST COMPARISON:  Recent abdominal radiographs 04/17/2019 FINDINGS: Left subclavian approach cardiac rhythm maintenance device. Leads project over the right atrium and right ventricle. The cardiopericardial silhouette is enlarged consistent with cardiomegaly. Atherosclerotic calcifications visualized in the transverse aorta. The lungs are hyperinflated. No evidence of pulmonary edema, focal consolidation or pneumothorax. No suspicious nodule or mass. Continued progression of previously ingested contrast material through the colon and into the rectum. Several small colonic diverticula are noted. Air-fluid levels are present throughout the colon  without associated dilation. Small amount of contrast remains visible within the appendix. No dilated loops of small bowel. Surgical clips are present in the right upper quadrant consistent with prior cholecystectomy. No acute osseous abnormality. No free air. IMPRESSION: 1. No acute cardiopulmonary process. 2. Cardiomegaly with cardiac rhythm maintenance device in situ. 3. Pulmonary hyperinflation raises the possibility of underlying COPD. 4. Progressive movement of oral contrast through the colon and into the rectum. 5. Multiple small air-fluid levels in the colon without evidence of distention or obstruction. 6. Scattered colonic diverticula. 7. Normal appendix. Electronically Signed   By: Jacqulynn Cadet M.D.   On: 04/18/2019 11:34   Dg Duanne Limerick W Double Cm (hd Ba)  Result Date: 04/15/2019 CLINICAL DATA:  Achalasia, dysphagia. EXAM: UPPER GI SERIES WITH KUB TECHNIQUE: After obtaining a scout radiograph a routine upper GI series was performed using thin and high density barium. FLUOROSCOPY TIME:  Radiation Exposure Index (if provided by the fluoroscopic device): 331.1 mGy. COMPARISON:  None. FINDINGS: Severe narrowing of the gastroesophageal junction is noted consistent with a history of achalasia. There is dilatation of the esophagus, particularly involving its distal portion. No definite mass is noted. However, there does appear to be some retained food debris within the distal esophagus. A large amount of the administered barium remained within the esophagus throughout the exam. Given the fact that there was a significant amount of retained barium within the distal esophagus, as well as the presence of the achalasia and food debris, barium tablet was not administered as it certainly would not have passed into the stomach. No definite hiatal hernia is noted. Visualization of the stomach was limited due to the achalasia. Persistent rounded defect is seen involving the gastric cardia which may represent external  compression, but possible mass cannot be excluded. Contrast flowed slowly from the stomach into the duodenum. The duodenal bulb and sweep are unremarkable. Status post cholecystectomy. IMPRESSION: Severe narrowing of the gastroesophageal junction is noted consistent with the given history of achalasia. There is significant dilatation of the esophagus with probable retained food debris seen in the distal stomach. Large amount of administered contrast remained within the esophagus throughout the exam, suggesting significant stenosis. Persistent rounded filling defect is seen involving the gastric cardia which may simply represent external compression, but possible mass cannot be excluded. Endoscopy is recommended for further evaluation. Electronically Signed   By: Marijo Conception M.D.   On: 04/15/2019 11:41     CBC Recent Labs  Lab 04/15/19 0907 04/15/19 1341 04/16/19 0358 04/17/19 0520 04/18/19 1045  WBC 3.6* 4.4 4.1 4.3 4.0  HGB 6.9* 6.9* 7.2*  7.2* 7.7*  HCT 24.5* 24.5* 24.4* 24.3* 27.0*  PLT 109* 107* 98* 95* 97*  MCV 72.5* 72.5* 72.0* 73.2* 74.6*  MCH 20.4* 20.4* 21.2* 21.7* 21.3*  MCHC 28.2* 28.2* 29.5* 29.6* 28.5*  RDW 20.1* 20.1* 19.9* 20.4* 20.4*  LYMPHSABS 0.8  --   --   --   --   MONOABS 0.4  --   --   --   --   EOSABS 0.2  --   --   --   --   BASOSABS 0.0  --   --   --   --     Chemistries  Recent Labs  Lab 04/15/19 0907 04/15/19 1341 04/16/19 0358 04/17/19 0520  NA 145 144 145 142  K 4.0 3.6 3.6 3.4*  CL 109 111 113* 107  CO2 29 28 27 27   GLUCOSE 95 105* 81 82  BUN 22 22 22 21   CREATININE 2.36* 2.38* 2.19* 2.26*  CALCIUM 8.8* 8.7* 8.7* 8.4*  AST 12* 11* 11*  --   ALT 11 11 10   --   ALKPHOS 40 41 45  --   BILITOT 0.4 0.4 0.8  --    ------------------------------------------------------------------------------------------------------------------ No results for input(s): CHOL, HDL, LDLCALC, TRIG, CHOLHDL, LDLDIRECT in the last 72 hours.  No results found for:  HGBA1C ------------------------------------------------------------------------------------------------------------------ No results for input(s): TSH, T4TOTAL, T3FREE, THYROIDAB in the last 72 hours.  Invalid input(s): FREET3 ------------------------------------------------------------------------------------------------------------------ No results for input(s): VITAMINB12, FOLATE, FERRITIN, TIBC, IRON, RETICCTPCT in the last 72 hours.  Coagulation profile No results for input(s): INR, PROTIME in the last 168 hours.  No results for input(s): DDIMER in the last 72 hours.  Cardiac Enzymes No results for input(s): CKMB, TROPONINI, MYOGLOBIN in the last 168 hours.  Invalid input(s): CK ------------------------------------------------------------------------------------------------------------------ No results found for: BNP   Roxan Hockey M.D on 04/18/2019 at 6:12 PM  Go to [email or web address] - for contact info  [facility] - Office  [phone]

## 2019-04-19 ENCOUNTER — Inpatient Hospital Stay (HOSPITAL_COMMUNITY): Payer: Medicare HMO

## 2019-04-19 LAB — BASIC METABOLIC PANEL
Anion gap: 8 (ref 5–15)
BUN: 18 mg/dL (ref 8–23)
CO2: 25 mmol/L (ref 22–32)
Calcium: 8.6 mg/dL — ABNORMAL LOW (ref 8.9–10.3)
Chloride: 108 mmol/L (ref 98–111)
Creatinine, Ser: 2.15 mg/dL — ABNORMAL HIGH (ref 0.61–1.24)
GFR calc Af Amer: 33 mL/min — ABNORMAL LOW (ref >60)
GFR calc non Af Amer: 28 mL/min — ABNORMAL LOW (ref >60)
Glucose, Bld: 77 mg/dL (ref 70–99)
Potassium: 3.6 mmol/L (ref 3.5–5.1)
Sodium: 141 mmol/L (ref 135–145)

## 2019-04-19 LAB — CBC
HCT: 25.6 % — ABNORMAL LOW (ref 39.0–52.0)
Hemoglobin: 7.3 g/dL — ABNORMAL LOW (ref 13.0–17.0)
MCH: 20.9 pg — ABNORMAL LOW (ref 26.0–34.0)
MCHC: 28.5 g/dL — ABNORMAL LOW (ref 30.0–36.0)
MCV: 73.4 fL — ABNORMAL LOW (ref 80.0–100.0)
Platelets: 96 10*3/uL — ABNORMAL LOW (ref 150–400)
RBC: 3.49 MIL/uL — ABNORMAL LOW (ref 4.22–5.81)
RDW: 20.7 % — ABNORMAL HIGH (ref 11.5–15.5)
WBC: 3.3 10*3/uL — ABNORMAL LOW (ref 4.0–10.5)
nRBC: 0 % (ref 0.0–0.2)

## 2019-04-19 MED ORDER — PANTOPRAZOLE SODIUM 40 MG PO TBEC: 40.0000 mg | DELAYED_RELEASE_TABLET | Freq: Every day | ORAL | 5 refills | Status: AC

## 2019-04-19 MED ORDER — AMLODIPINE BESYLATE 5 MG PO TABS: 10.0000 mg | ORAL_TABLET | Freq: Once | ORAL | Status: DC

## 2019-04-19 MED ORDER — AMLODIPINE BESYLATE 10 MG PO TABS: 10.0000 mg | ORAL_TABLET | Freq: Every day | ORAL | 11 refills | Status: AC

## 2019-04-19 MED ORDER — ACETAMINOPHEN 325 MG PO TABS: 650.0000 mg | ORAL_TABLET | Freq: Four times a day (QID) | ORAL | 1 refills | Status: AC | PRN

## 2019-04-19 NOTE — Discharge Instructions (Signed)
1)Avoid ibuprofen/Advil/Aleve/Motrin/Goody Powders/Naproxen/BC powders/Meloxicam/Diclofenac/Indomethacin and other Nonsteroidal anti-inflammatory medications as these will make you more likely to bleed and can cause stomach ulcers, can also cause Kidney problems.   2)Follow -up with gastroenterologist Dr. Gala Romney in [place] later this week (around 04/22/2019) -Address: [address] [place], [place] [address]-------call Phone: [phone]..... For recheck and reevaluation and to have outpatient CT chest and abdomen scheduled for you --Depending on the finding of the CT chest, and abdomen -you may need GI evaluation with manometry and GI surgery evaluation for repeat achalasia surgery (Heller Myotomy Procedure)  3) full liquid/very soft diet advised--- avoid solids as they will get stuck in your food pipe  4) you will need to be upright for at least 90 minutes after you eat--- please avoid laying down shortly after eating  5) nutritional supplements advised--including boost or Ensure  6) you need monthly vitamin B12 shots  7)Stop Meloxicam/Mobic as noted above #1 above as this can make you bleed from your stomach (causes irritation and ulcers)  8) repeat CBC/complete blood count blood test when you see Dr. Gala Romney around 04/22/2019 due to anemia

## 2019-04-19 NOTE — Progress Notes (Signed)
Nsg Discharge Note  Admit Date:  04/15/2019 Discharge date: 04/19/2019   Yisroel Mullendore to be D/C'd Home per MD order.  AVS completed.  Patient able to verbalize understanding.  Discharge Medication: Allergies as of 04/19/2019   No Known Allergies     Medication List    STOP taking these medications   lisinopril 20 MG tablet Commonly known as: ZESTRIL   meloxicam 7.5 MG tablet Commonly known as: MOBIC     TAKE these medications   acetaminophen 325 MG tablet Commonly known as: TYLENOL Take 2 tablets (650 mg total) by mouth every 6 (six) hours as needed for mild pain (or Fever >/= 101).   albuterol (2.5 MG/3ML) 0.083% nebulizer solution Commonly known as: PROVENTIL Inhale 3 mLs into the lungs every 6 (six) hours as needed for wheezing or shortness of breath.   amLODipine 10 MG tablet Commonly known as: NORVASC Take 1 tablet (10 mg total) by mouth daily.   calcitRIOL 0.5 MCG capsule Commonly known as: ROCALTROL Take 0.5 mcg by mouth every morning.   famotidine 40 MG tablet Commonly known as: PEPCID Take 40 mg by mouth 2 (two) times a day.   mirtazapine 15 MG tablet Commonly known as: REMERON Take 15 mg by mouth at bedtime.   Namzaric 7-10 MG Cp24 Generic drug: Memantine HCl-Donepezil HCl Take 1 capsule by mouth every morning.   tamsulosin 0.4 MG Caps capsule Commonly known as: FLOMAX Take 0.4 mg by mouth every morning.   Vitamin D (Cholecalciferol) 10 MCG (400 UNIT) Tabs Take 2 tablets by mouth every morning.       Discharge Assessment: Vitals:   04/19/19 0518 04/19/19 1519  BP: (!) 173/108 (!) 163/98  Pulse: 63 61  Resp: 16 18  Temp: 98.3 F (36.8 C) 98.7 F (37.1 C)  SpO2: 97% 99%   Skin clean, dry and intact without evidence of skin break down, no evidence of skin tears noted. IV catheter discontinued intact. Site without signs and symptoms of complications - no redness or edema noted at insertion site, patient denies c/o pain - only slight tenderness  at site.  Dressing with slight pressure applied.  D/c Instructions-Education: Discharge instructions given to patient/family with verbalized understanding. D/c education completed with patient including follow up instructions, medication list, d/c activities limitations if indicated, with other d/c instructions as indicated by MD - patient able to verbalize understanding, all questions fully answered. Patient instructed to return to ED, call 911, or call MD for any changes in condition.  Patient escorted via [place], and D/C home via private auto.  Argyle Gustafson Loletha Grayer, RN 04/19/2019 3:50 PM

## 2019-04-19 NOTE — Discharge Summary (Signed)
Curtis Mcintosh, is a 78 y.o. male  DOB [birth year]  MRN [id number].  Admission date:  04/15/2019  Admitting Physician  Costin Karlyne Greenspan, MD  Discharge Date:  04/19/2019   Primary MD  Monico Blitz, MD  Recommendations for primary care physician for things to follow:   1)Avoid ibuprofen/Advil/Aleve/Motrin/Goody Powders/Naproxen/BC powders/Meloxicam/Diclofenac/Indomethacin and other Nonsteroidal anti-inflammatory medications as these will make you more likely to bleed and can cause stomach ulcers, can also cause Kidney problems.   2)Follow -up with gastroenterologist Dr. Gala Romney in [place] later this week (around 04/22/2019) -Address: [address], [place], [place] [address]-------call Phone: [phone]..... For recheck and reevaluation and to have outpatient CT chest and abdomen scheduled for you --Depending on the finding of the CT chest, and abdomen -you may need GI evaluation with manometry and GI surgery evaluation for repeat achalasia surgery (Heller Myotomy Procedure)  3) full liquid/very soft diet advised--- avoid solids as they will get stuck in your food pipe  4) you will need to be upright for at least 90 minutes after you eat--- please avoid laying down shortly after eating  5) nutritional supplements advised--including boost or Ensure  6) you need monthly vitamin B12 shots  7)Stop Meloxicam/Mobic as noted above #1 above as this can make you bleed from your stomach (causes irritation and ulcers)  8)Repeat CBC/complete blood count blood test when you see Dr. Gala Romney around 04/22/2019 due to anemia  Admission Diagnosis  Symptomatic anemia [D64.9] Gastrointestinal hemorrhage, unspecified gastrointestinal hemorrhage type [K92.2]   Discharge Diagnosis  Symptomatic anemia [D64.9] Gastrointestinal hemorrhage, unspecified gastrointestinal hemorrhage type [K92.2]    Active Problems:   Melena   Gastrointestinal  hemorrhage   Iron deficiency anemia due to chronic blood loss   Esophageal dysphagia   Abnormal UGI series   Abdominal pain      Past Medical History:  Diagnosis Date   A-fib ([place])    Achalasia    Alzheimer's dementia ([place])    Aortic insufficiency    Family history of adverse reaction to anesthesia    Unknown   HTN (hypertension)    Prostate cancer ([place])    prostate   Renal disorder    stage 3 kidney failure   Sick sinus syndrome ([place])    pacemaker in remote past    Past Surgical History:  Procedure Laterality Date   CHOLECYSTECTOMY     ESOPHAGOGASTRODUODENOSCOPY  2011   [facility]: dilated esophagus and tight LES, s/p 25 units Botox, multiple antral polyps s/p biopsy   ESOPHAGOGASTRODUODENOSCOPY  2016   Last EGD in 2016 at [facility] with fluid-filled esophagus with dilatation, s/p dilation. Tight at EG junction. polypoid gastritis in antrum   HELLER MYOTOMY  2012   [facility]     pH and manometry  2012   motility compatible with achalasia or complete aperistalsis, or compatible with s/p Heller myotomy, acidity within esophagus.        HPI  from the history and physical done on the day of admission:  PCP: Monico Blitz, MD  Patient coming from: Home  Chief Complaint: Low hemoglobin  HPI: Curtis Mcintosh is a 78 y.o. male with medical history significant of dementia, hypertension, prostate cancer, chronic kidney disease, sick sinus syndrome status post pacemaker, history of A. fib, comes to the hospital with complaints of low hemoglobin level.  He was evaluated in the GI office for achalasia, and blood work showed a hemoglobin of 6.9 and was sent to the ED.  Patient has underlying dementia and is a poor historian but he can tell me that over the last couple of weeks he has been having black stools.  He denies Goody's powder, denies NSAIDs or history of GI bleed.  He denies any nausea or vomiting.  He denies any abdominal pain,  denies any diarrhea.  He denies any fever or chills.  He is also telling me that his food is been getting stuck in his chest for a long time but is not sure how long  ED Course: In the emergency room he is afebrile, blood pressure is on the high side in the 749 systolic, he is satting well on room air.  Blood work reveals a creatinine of 2.38, BUN 22, low iron and ferritin levels.  B12 is 210.  CBC shows a hemoglobin of 6.9 and MCV of 72.5.  His platelets are 107.  Review of Systems: As per HPI otherwise 10 point review of systems negative.    Hospital Course:   Brief Summary 78 y.o.malewith medical history significant ofdementia, hypertension, prostate cancer, CKD III, h/o Afib, aortic insufficiency, sick sinus syndrome status post pacemaker/AICD, history of A. fib, achalasia and dysphagia status post Heller myotomy with fundoplication in 4496  admitted on 04/15/2019 with a hemoglobin of 6.9 and heme +ve stool while on Mobic. -- Tolerating full liquid diet well --Extensive conversations with patient and his daughter regarding discharge plan and instructions prior to discharge home  A/p 1)Acute Blood Loss Anemia with Melena --Upper GI bleed in the setting of Mobic use -Work-up reveals iron deficiency and borderline B12 levels.  -GI consult appreciated  - EGD on 04/16/2019 noted as below -Hemoglobin is up to 7.3 from 6.9 after transfusion of 1 unit of packed cells -Give monthly B12 shots -Repeat CBC with gastroenterologist on 04/22/2019 as outpatient  2)Dysphagia/Achalasia--- swallowing difficulties persist,  -Tolerating  full liquids -Contrast material from upper GI series with barium from 04/14/2022 present on abdominal x-ray from 04/19/2019 -Awaiting passage of contrast material in order to be able to do CT chest, abdomen and pelvis--to rule out  possibility of pseudo - achalasia from external mass-effect. --Dr. Gala Romney will arrange for outpatient CT chest and abdomen --If CT chest  and abdomen does not show extrinsic etiology for esophageal stenosis patient may need repeat myotomy for achalasia (previously had Heller myotomy in 2012)   3)AKI----acute kidney injury on CKD stage - III, worsening renal function in the setting of acute blood loss-hypovolemia   creatinine on admission=2.38 ,   baseline creatinine = 1.7 to 1.97 ((Records from [facility])   , creatinine is now= 2.1     , renally adjust medications, avoid nephrotoxic agents/dehydration/hypotension --- Discontinue lisinopril, stop meloxicam  4)Hypertension -Stop lisinopril, start amlodipine  5)History of atrial fibrillation/H/o  sick sinus syndrome status post pacemaker/AICD placement --Records from care everywhere indicates that on 02/16/19 --patient's cardiologist Dr. Birdie Sons at [facility] cardiology in [place] --- discussed risk versus benefit of anticoagulation with patient and daughter --given low atrial fibrillation burden on device  interrogation and history of recurrent falls and concerns about bleeding risk patient's daughter [name] and patient along with cardiologist Dr. Birdie Sons decided against Full anticoagulation   -Given concerns about ongoing GI blood loss at this time continue to avoid for anticoagulation  6)acute on chronic symptomatic anemia due to acute blood loss--- baseline hemoglobin usually around 8 (Records from [facility] --Patient received 1 unit of packed cells, hemoglobin is  7.3, -Ambulating in the room without dyspnea on exertion, no hypoxia, no dizziness no palpitations -Advised repeat CBC around 04/22/2019 with GI physician as outpatient  Dementia -Mild, continue home medications   Code Status:Full code Family Communication:Discussed with patient and daughter, questions answered  Consults called:Gastroenterology    Code Status : Full  Family Communication:   (patient is alert, awake and coherent)  daughter [name]   Disposition Plan  :   Home  Procedures--  UGI Series on 04/15/19 -upper GI series showed severe narrowing at the GE junction consistent with history of achalasia. Significant dilation of the esophagus with probable retained food debris seen in the distal stomach. Large amount of administered contrast remained within the esophagus throughout the exam, suggesting significant stenosis. Persistent rounded filling defect seen involving the gastric cardia, cannot exclude mass. Endoscopy recommended.  EGD 04/16/19 Massively dilated esophagus with stasis of food. Fixed stricture status post Maloney dilation. altered/abnormal appearing cardia retroflexed of uncertain significance. Need to rule out pseudo - achalasia. Gastric polyp status post snare polypectomy and hemostasis clipping. I am concerned about the possibility of pseudo - achalasia from external mass-effect. CT needed. If that is found not to be the case,. 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   Consults  :  Gi  DVT Prophylaxis  :   - SCDs (Gi bleed)  Discharge Condition: stable  Follow UP-Dr. Gala Romney gastroenterology  Diet and Activity recommendation:  As advised  Discharge Instructions     Discharge Instructions    Call MD for:  difficulty breathing, headache or visual disturbances   Complete by: As directed    Call MD for:  persistant dizziness or light-headedness   Complete by: As directed    Call MD for:  persistant nausea and vomiting   Complete by: As directed    Call MD for:  severe uncontrolled pain   Complete by: As directed    Call MD for:  temperature >100.4   Complete by: As directed    Diet full liquid   Complete by: As directed    Discharge instructions   Complete by: As directed    1)Avoid ibuprofen/Advil/Aleve/Motrin/Goody Powders/Naproxen/BC  powders/Meloxicam/Diclofenac/Indomethacin and other Nonsteroidal anti-inflammatory medications as these will make you more likely to bleed and can cause stomach ulcers, can also cause Kidney problems.   2)Follow -up with gastroenterologist Dr. Gala Romney in [place] later this week (around 04/22/2019) -Address: [address] [place], [place] [address]-------call Phone: [phone]..... For recheck and reevaluation and to have outpatient CT chest and abdomen scheduled for you --Depending on the finding of the CT chest, and abdomen -you may need GI evaluation with manometry and GI surgery evaluation for repeat achalasia surgery (Heller Myotomy Procedure)  3) full liquid/very soft diet advised--- avoid solids as they will get stuck in your food pipe  4) you will need to be upright for at least 90 minutes after you eat--- please avoid laying down shortly  after eating  5) nutritional supplements advised--including boost or Ensure  6) you need monthly vitamin B12 shots  7)Stop Meloxicam/Mobic as noted above #1 above as this can make you bleed from your stomach (causes irritation and ulcers)  8) repeat CBC/complete blood count blood test when you see Dr. Gala Romney around 04/22/2019 due to anemia   Increase activity slowly   Complete by: As directed         Discharge Medications     Allergies as of 04/19/2019   No Known Allergies     Medication List    STOP taking these medications   famotidine 40 MG tablet Commonly known as: PEPCID   lisinopril 20 MG tablet Commonly known as: ZESTRIL   meloxicam 7.5 MG tablet Commonly known as: MOBIC     TAKE these medications   acetaminophen 325 MG tablet Commonly known as: TYLENOL Take 2 tablets (650 mg total) by mouth every 6 (six) hours as needed for mild pain (or Fever >/= 101).   albuterol (2.5 MG/3ML) 0.083% nebulizer solution Commonly known as: PROVENTIL Inhale 3 mLs into the lungs every 6 (six) hours as needed for wheezing or shortness of  breath.   amLODipine 10 MG tablet Commonly known as: NORVASC Take 1 tablet (10 mg total) by mouth daily.   calcitRIOL 0.5 MCG capsule Commonly known as: ROCALTROL Take 0.5 mcg by mouth every morning.   mirtazapine 15 MG tablet Commonly known as: REMERON Take 15 mg by mouth at bedtime.   Namzaric 7-10 MG Cp24 Generic drug: Memantine HCl-Donepezil HCl Take 1 capsule by mouth every morning.   pantoprazole 40 MG tablet Commonly known as: Protonix Take 1 tablet (40 mg total) by mouth daily.   tamsulosin 0.4 MG Caps capsule Commonly known as: FLOMAX Take 0.4 mg by mouth every morning.   Vitamin D (Cholecalciferol) 10 MCG (400 UNIT) Tabs Take 2 tablets by mouth every morning.       Major procedures and Radiology Reports - PLEASE review detailed and final reports for all details, in brief -    Dg Abd 1 View  Result Date: 04/19/2019 CLINICAL DATA:  Abdominal pain EXAM: ABDOMEN - 1 VIEW COMPARISON:  Radiograph 04/18/2019 FINDINGS: No dilated large or small bowel. Oral contrast is now near entirely in the LEFT colon and rectum. IMPRESSION: No bowel obstruction. Electronically Signed   By: Suzy Bouchard M.D.   On: 04/19/2019 13:40   Dg Abd 1 View  Result Date: 04/17/2019 CLINICAL DATA:  Abdominal pain EXAM: ABDOMEN - 1 VIEW COMPARISON:  Upper GI 04/15/2019 FINDINGS: No dilated loops of bowel identified. Enteric contrast material from upper GI has progressed to the colon beyond the level of the splenic flexure. No dilated loops of small bowel identified. IMPRESSION: 1. Persistent contrast material identified within the colon predominantly involving the transverse colon and splenic flexure. 2. No dilated small bowel loops. Electronically Signed   By: Kerby Moors M.D.   On: 04/17/2019 09:16   Dg Abd Acute 2+v W 1v Chest  Result Date: 04/18/2019 CLINICAL DATA:  78 year old male with abdominal pain EXAM: DG ABDOMEN ACUTE W/ 1V CHEST COMPARISON:  Recent abdominal radiographs  04/17/2019 FINDINGS: Left subclavian approach cardiac rhythm maintenance device. Leads project over the right atrium and right ventricle. The cardiopericardial silhouette is enlarged consistent with cardiomegaly. Atherosclerotic calcifications visualized in the transverse aorta. The lungs are hyperinflated. No evidence of pulmonary edema, focal consolidation or pneumothorax. No suspicious nodule or mass. Continued progression of previously ingested contrast material  through the colon and into the rectum. Several small colonic diverticula are noted. Air-fluid levels are present throughout the colon without associated dilation. Small amount of contrast remains visible within the appendix. No dilated loops of small bowel. Surgical clips are present in the right upper quadrant consistent with prior cholecystectomy. No acute osseous abnormality. No free air. IMPRESSION: 1. No acute cardiopulmonary process. 2. Cardiomegaly with cardiac rhythm maintenance device in situ. 3. Pulmonary hyperinflation raises the possibility of underlying COPD. 4. Progressive movement of oral contrast through the colon and into the rectum. 5. Multiple small air-fluid levels in the colon without evidence of distention or obstruction. 6. Scattered colonic diverticula. 7. Normal appendix. Electronically Signed   By: Jacqulynn Cadet M.D.   On: 04/18/2019 11:34   Dg Duanne Limerick W Double Cm (hd Ba)  Result Date: 04/15/2019 CLINICAL DATA:  Achalasia, dysphagia. EXAM: UPPER GI SERIES WITH KUB TECHNIQUE: After obtaining a scout radiograph a routine upper GI series was performed using thin and high density barium. FLUOROSCOPY TIME:  Radiation Exposure Index (if provided by the fluoroscopic device): 331.1 mGy. COMPARISON:  None. FINDINGS: Severe narrowing of the gastroesophageal junction is noted consistent with a history of achalasia. There is dilatation of the esophagus, particularly involving its distal portion. No definite mass is noted. However, there  does appear to be some retained food debris within the distal esophagus. A large amount of the administered barium remained within the esophagus throughout the exam. Given the fact that there was a significant amount of retained barium within the distal esophagus, as well as the presence of the achalasia and food debris, barium tablet was not administered as it certainly would not have passed into the stomach. No definite hiatal hernia is noted. Visualization of the stomach was limited due to the achalasia. Persistent rounded defect is seen involving the gastric cardia which may represent external compression, but possible mass cannot be excluded. Contrast flowed slowly from the stomach into the duodenum. The duodenal bulb and sweep are unremarkable. Status post cholecystectomy. IMPRESSION: Severe narrowing of the gastroesophageal junction is noted consistent with the given history of achalasia. There is significant dilatation of the esophagus with probable retained food debris seen in the distal stomach. Large amount of administered contrast remained within the esophagus throughout the exam, suggesting significant stenosis. Persistent rounded filling defect is seen involving the gastric cardia which may simply represent external compression, but possible mass cannot be excluded. Endoscopy is recommended for further evaluation. Electronically Signed   By: Marijo Conception M.D.   On: 04/15/2019 11:41    Micro Results   Recent Results (from the past 240 hour(s))  SARS Coronavirus 2 [facility] order, Performed in [facility] hospital lab) Nasopharyngeal Nasopharyngeal Swab     Status: None   Collection Time: 04/15/19  3:40 PM   Specimen: Nasopharyngeal Swab  Result Value Ref Range Status   SARS Coronavirus 2 NEGATIVE NEGATIVE Final    Comment: (NOTE) If result is NEGATIVE SARS-CoV-2 target nucleic acids are NOT DETECTED. The SARS-CoV-2 RNA is generally detectable in upper and lower  respiratory specimens  during the acute phase of infection. The lowest  concentration of SARS-CoV-2 viral copies this assay can detect is 250  copies / mL. A negative result does not preclude SARS-CoV-2 infection  and should not be used as the sole basis for treatment or other  patient management decisions.  A negative result may occur with  improper specimen collection / handling, submission of specimen other  than nasopharyngeal  swab, presence of viral mutation(s) within the  areas targeted by this assay, and inadequate number of viral copies  (<250 copies / mL). A negative result must be combined with clinical  observations, patient history, and epidemiological information. If result is POSITIVE SARS-CoV-2 target nucleic acids are DETECTED. The SARS-CoV-2 RNA is generally detectable in upper and lower  respiratory specimens dur ing the acute phase of infection.  Positive  results are indicative of active infection with SARS-CoV-2.  Clinical  correlation with patient history and other diagnostic information is  necessary to determine patient infection status.  Positive results do  not rule out bacterial infection or co-infection with other viruses. If result is PRESUMPTIVE POSTIVE SARS-CoV-2 nucleic acids MAY BE PRESENT.   A presumptive positive result was obtained on the submitted specimen  and confirmed on repeat testing.  While 2019 novel coronavirus  (SARS-CoV-2) nucleic acids may be present in the submitted sample  additional confirmatory testing may be necessary for epidemiological  and / or clinical management purposes  to differentiate between  SARS-CoV-2 and other Sarbecovirus currently known to infect humans.  If clinically indicated additional testing with an alternate test  methodology [phone]) is advised. The SARS-CoV-2 RNA is generally  detectable in upper and lower respiratory sp ecimens during the acute  phase of infection. The expected result is Negative. Fact Sheet for Patients:   StrictlyIdeas.no Fact Sheet for Healthcare Providers: BankingDealers.co.za This test is not yet approved or cleared by the [place] FDA and has been authorized for detection and/or diagnosis of SARS-CoV-2 by FDA under an Emergency Use Authorization (EUA).  This EUA will remain in effect (meaning this test can be used) for the duration of the COVID-19 declaration under Section 564(b)(1) of the Act, 21 U.S.C. section 360bbb-3(b)(1), unless the authorization is terminated or revoked sooner. Performed at [facility], [address], [place], [place] [address]        Today   [place] today has no new complaints,  -Tolerating full liquid diet well,  No abdominal pain, no nausea no vomiting -Had small BM          Patient has been seen and examined prior to discharge   Objective   Blood pressure (!) 163/98, pulse 61, temperature 98.7 F (37.1 C), temperature source Oral, resp. rate 18, height 6\' 6"  (1.981 m), weight 79.4 kg, SpO2 99 %.   Intake/Output Summary (Last 24 hours) at 04/19/2019 1611 Last data filed at 04/19/2019 1500 Gross per 24 hour  Intake 240 ml  Output 550 ml  Net -310 ml   Exam Gen:- Awake Alert,  In no apparent distress  HEENT:- [place].AT, No sclera icterus Neck-Supple Neck,No JVD,.  Lungs-  CTAB , fair symmetrical air movement CV- S1, S2 normal, regular ,  pacemaker/AICD Abd-  +ve B.Sounds, Abd Soft, no abdominal tenderness Extremity/Skin:- No  edema, pedal pulses present  Psych-affect is appropriate, oriented x3 Neuro-no new focal deficits, no tremors   Data Review   CBC w Diff:  Lab Results  Component Value Date   WBC 3.3 (L) 04/19/2019   HGB 7.3 (L) 04/19/2019   HCT 25.6 (L) 04/19/2019   PLT 96 (L) 04/19/2019   LYMPHOPCT 23 04/15/2019   MONOPCT 10 04/15/2019   EOSPCT 6 04/15/2019   BASOPCT 0 04/15/2019   CMP:  Lab Results  Component Value Date   NA 141 04/19/2019   K 3.6  04/19/2019   CL 108 04/19/2019   CO2 25 04/19/2019  BUN 18 04/19/2019   CREATININE 2.15 (H) 04/19/2019   PROT 4.9 (L) 04/16/2019   ALBUMIN 3.0 (L) 04/16/2019   BILITOT 0.8 04/16/2019   ALKPHOS 45 04/16/2019   AST 11 (L) 04/16/2019   ALT 10 04/16/2019  . Total Discharge time is about 33 minutes  Roxan Hockey M.D on 04/19/2019 at 4:11 PM  Go to [email or web address] -  for contact info  [facility] - Office  [phone]

## 2019-04-20 ENCOUNTER — Telehealth: Payer: Self-pay

## 2019-04-20 ENCOUNTER — Encounter (HOSPITAL_COMMUNITY): Payer: Self-pay | Admitting: Internal Medicine

## 2019-04-20 DIAGNOSIS — R933 Abnormal findings on diagnostic imaging of other parts of digestive tract: Secondary | ICD-10-CM

## 2019-04-20 DIAGNOSIS — K220 Achalasia of cardia: Secondary | ICD-10-CM

## 2019-04-20 NOTE — Telephone Encounter (Signed)
Rourk, Cristopher Estimable, MD  Carlis Stable, NP; Derrick Ravel, CMA        Too much barium in upper GI tract to do a CT. Patient going home over the weekend. We need to order a CT of the GE junction to rule out a tumor middle of this upcoming week. If negative, on too [place] for a manometry. If that confirms achalasia on to GI surgery at [facility] to the myotomy specialist.    Routing message to [place], to schedule CT of the GE junction to r/o a tumor middle of this upcoming week per RMR.

## 2019-04-20 NOTE — Addendum Note (Signed)
Addended by: Hassan Rowan on: 04/20/2019 08:58 AM   Modules accepted: Orders

## 2019-04-20 NOTE — Telephone Encounter (Signed)
Called and informed pt's daughter [name]) of CT appt and details.

## 2019-04-20 NOTE — Telephone Encounter (Signed)
EG spoke to radiologist last week. Pt needs CT chest and abdomen with contrast.  Submitted PA for CT chest and abdomen via [employer] for [employer]. Case approved. PA# [id number], valid 04/20/19-10/17/19. Called [employer] and spoke to intake rep, NPI for [facility] corrected. No PA needed for Medicaid.  CT chest/abd scheduled for 04/23/19 at 12:00pm, arrive at 11:45am. NPO 4 hours prior to test and pick up contrast prior to appt.

## 2019-04-21 ENCOUNTER — Encounter: Payer: Self-pay | Admitting: Internal Medicine

## 2019-04-23 ENCOUNTER — Other Ambulatory Visit: Payer: Self-pay

## 2019-04-23 ENCOUNTER — Telehealth: Payer: Self-pay | Admitting: Internal Medicine

## 2019-04-23 ENCOUNTER — Ambulatory Visit (HOSPITAL_COMMUNITY)
Admission: RE | Admit: 2019-04-23 | Discharge: 2019-04-23 | Disposition: A | Discharge status: Home / Self Care | Payer: Medicare HMO | Source: Ambulatory Visit | Attending: Internal Medicine | Admitting: Internal Medicine

## 2019-04-23 DIAGNOSIS — K220 Achalasia of cardia: Secondary | ICD-10-CM | POA: Diagnosis present

## 2019-04-23 DIAGNOSIS — R933 Abnormal findings on diagnostic imaging of other parts of digestive tract: Secondary | ICD-10-CM | POA: Diagnosis present

## 2019-04-23 MED ORDER — IOHEXOL 300 MG/ML  SOLN
75.0000 mL | Freq: Once | INTRAMUSCULAR | Status: AC | PRN
  Administered 2019-04-23: 75 mL via INTRAVENOUS

## 2019-04-23 NOTE — Telephone Encounter (Signed)
[phone] patient daughter called wanting to know if her father needs to continue with his clear liquid diet. He went for his cat scan this morning.

## 2019-04-23 NOTE — Telephone Encounter (Signed)
Spoke with pts daughter and the clear liquids were to be done prior to pts procedure. It's ok for pt to d/c the clear liquid diet.

## 2019-04-28 ENCOUNTER — Telehealth: Payer: Self-pay | Admitting: Internal Medicine

## 2019-04-28 NOTE — Telephone Encounter (Addendum)
He had a known abnormal round filling defect on prior imaging (UGI), and underwent EGD while inpatient. CT chest/abd/pelvis reviewed. A solid mass noted posterior wall of gastric cardia is seen. No enlarged lymph nodes that would raise concern for metastatic disease. Esophagus filled with oral contrast, patulous.    I called patient's daughter and reviewed. Informed we will review with Dr. Gala Romney plan regarding referral for further management, including further diagnostic evaluation (i.e. EUS with biopsy locally or tertiary facility?)  For now, needs to remain on full liquids, sit upright while eating and remain upright for at least 1 hour after meals. Do not lay down for 3 hours after eating.   Currently, he is at his PCPs office with new onset significant lower extremity edema.

## 2019-04-28 NOTE — Telephone Encounter (Signed)
Result letter for polyp removed was mailed to Curtis Mcintosh. I read the letter over the phone with Curtis Mcintosh's daughter. Curtis Mcintosh's daughter is inquiring about the CT results and when Curtis Mcintosh will have surgery. Curtis Mcintosh's daughter states that she was told at [facility] that Curtis Mcintosh would need a surgery at [facility]. Please advise.

## 2019-04-28 NOTE — Telephone Encounter (Signed)
Noted  

## 2019-04-28 NOTE — Telephone Encounter (Signed)
PATIENT DAUGHTER CALLED AND ASKED ABOUT PATIENT'S CT RESULTS FROM LAST WEEK  ALSO WANTED [place] TO KNOW HE IS GOING TO HIS PCP THIS MORNING BECAUSE HIS FEET WERE SEVERELY  SWOLLEN

## 2019-04-29 ENCOUNTER — Telehealth: Payer: Self-pay | Admitting: *Deleted

## 2019-04-29 ENCOUNTER — Other Ambulatory Visit: Payer: Self-pay | Admitting: *Deleted

## 2019-04-29 DIAGNOSIS — R9389 Abnormal findings on diagnostic imaging of other specified body structures: Secondary | ICD-10-CM

## 2019-04-29 NOTE — Telephone Encounter (Signed)
See results note on this pt

## 2019-04-29 NOTE — Telephone Encounter (Signed)
Spoke with Dr. Emmie Niemann office. They are in the process of transitioning right now. They have already spoke with daughter. They will schedule patient for sometime in [facility] September. Patient had an appt back in March but NS'd. FYI AB

## 2019-04-30 ENCOUNTER — Telehealth: Payer: Self-pay

## 2019-04-30 NOTE — Telephone Encounter (Signed)
Mansouraty, Telford Nab., MD  Timothy Lasso, RN; Milus Banister, MD        Naijah Lacek,  Looks like an submucosal mass at the [employer] and Fundus.   Patient needs Radial/Linear with FNB.  Patient should be on clear liquids for 24 hours prior to his procedure.  Please schedule next available with Linna Hoff or myself and let us know when it is scheduled.  Thanks.  GM

## 2019-05-01 ENCOUNTER — Other Ambulatory Visit: Payer: Self-pay

## 2019-05-01 DIAGNOSIS — R9389 Abnormal findings on diagnostic imaging of other specified body structures: Secondary | ICD-10-CM

## 2019-05-01 DIAGNOSIS — K639 Disease of intestine, unspecified: Secondary | ICD-10-CM

## 2019-05-01 NOTE — Telephone Encounter (Signed)
EUS scheduled, pt instructed and medications reviewed.  Patient instructions mailed to home.  Patient to call with any questions or concerns. The pt will be on clear liquids 24 hours prior to procedure and has been advised of COVID testing instructions.

## 2019-05-02 ENCOUNTER — Telehealth: Payer: Self-pay | Admitting: Internal Medicine

## 2019-05-02 NOTE — Telephone Encounter (Signed)
Reviewed work-up thus far with patient's daughter, [name].  Mass in proximal stomach and possibility of some persisting symptoms of primary achalasia reviewed.  Reasoning behind pursuing an endoscopic ultrasound also reviewed.  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  Once we get results of EUS, we will be able to make further management recommendations.  Her questions were answered.

## 2019-05-04 NOTE — Telephone Encounter (Signed)
Noted  

## 2019-05-25 ENCOUNTER — Other Ambulatory Visit (HOSPITAL_COMMUNITY)
Admission: RE | Admit: 2019-05-25 | Discharge: 2019-05-25 | Disposition: A | Discharge status: Home / Self Care | Payer: Medicare HMO | Source: Ambulatory Visit | Attending: Gastroenterology | Admitting: Gastroenterology

## 2019-05-25 DIAGNOSIS — Z20828 Contact with and (suspected) exposure to other viral communicable diseases: Secondary | ICD-10-CM | POA: Diagnosis not present

## 2019-05-25 DIAGNOSIS — Z01812 Encounter for preprocedural laboratory examination: Secondary | ICD-10-CM | POA: Diagnosis present

## 2019-05-26 ENCOUNTER — Encounter (HOSPITAL_COMMUNITY): Payer: Self-pay | Admitting: *Deleted

## 2019-05-26 ENCOUNTER — Other Ambulatory Visit: Payer: Self-pay

## 2019-05-26 NOTE — Progress Notes (Addendum)
PCP -  [facility] Cardiologist - DR Marice Potter LOV 02-16-19 CARE EVERYWHERE  Chest CT 04-16-19 EPIC EKG - 04-16-19 epic Stress Test - 04-16-2013 care everywhere ECHO - none Cardiac Cath - none  Sleep Study - none CPAP -   Fasting Blood Sugar - n/a Checks Blood Sugar _____ times a day  Blood Thinner Instructions:none Aspirin Instructions:none Last Dose:  Anesthesia review: to [place] zanetto pa for review  Patient denies shortness of breath, fever, cough and chest pain at PAT appointment   Patient verbalized understanding of instructions that were given to them at the PAT appointment. Patient was also instructed that they will need to review over the PAT instructions again at home before surgery.

## 2019-05-27 LAB — NOVEL CORONAVIRUS, NAA (HOSP ORDER, SEND-OUT TO REF LAB; TAT 18-24 HRS): SARS-CoV-2, NAA: NOT DETECTED

## 2019-05-28 ENCOUNTER — Ambulatory Visit (HOSPITAL_COMMUNITY): Payer: Medicare HMO | Admitting: Physician Assistant

## 2019-05-28 ENCOUNTER — Encounter (HOSPITAL_COMMUNITY)
Admission: RE | Disposition: A | Discharge status: Home / Self Care | Payer: Self-pay | Source: Home / Self Care | Attending: Gastroenterology

## 2019-05-28 ENCOUNTER — Other Ambulatory Visit: Payer: Self-pay

## 2019-05-28 ENCOUNTER — Ambulatory Visit (HOSPITAL_COMMUNITY)
Admission: RE | Admit: 2019-05-28 | Discharge: 2019-05-28 | Disposition: A | Discharge status: Home / Self Care | Payer: Medicare HMO | Attending: Gastroenterology | Admitting: Gastroenterology

## 2019-05-28 ENCOUNTER — Encounter (HOSPITAL_COMMUNITY): Payer: Self-pay | Admitting: Anesthesiology

## 2019-05-28 DIAGNOSIS — F0280 Dementia in other diseases classified elsewhere without behavioral disturbance: Secondary | ICD-10-CM | POA: Insufficient documentation

## 2019-05-28 DIAGNOSIS — Z79899 Other long term (current) drug therapy: Secondary | ICD-10-CM | POA: Diagnosis not present

## 2019-05-28 DIAGNOSIS — R933 Abnormal findings on diagnostic imaging of other parts of digestive tract: Secondary | ICD-10-CM

## 2019-05-28 DIAGNOSIS — K220 Achalasia of cardia: Secondary | ICD-10-CM | POA: Insufficient documentation

## 2019-05-28 DIAGNOSIS — G309 Alzheimer's disease, unspecified: Secondary | ICD-10-CM | POA: Diagnosis not present

## 2019-05-28 DIAGNOSIS — Z8546 Personal history of malignant neoplasm of prostate: Secondary | ICD-10-CM | POA: Diagnosis not present

## 2019-05-28 DIAGNOSIS — K639 Disease of intestine, unspecified: Secondary | ICD-10-CM

## 2019-05-28 DIAGNOSIS — Z950 Presence of cardiac pacemaker: Secondary | ICD-10-CM | POA: Insufficient documentation

## 2019-05-28 DIAGNOSIS — D649 Anemia, unspecified: Secondary | ICD-10-CM | POA: Insufficient documentation

## 2019-05-28 DIAGNOSIS — I495 Sick sinus syndrome: Secondary | ICD-10-CM | POA: Insufficient documentation

## 2019-05-28 DIAGNOSIS — K319 Disease of stomach and duodenum, unspecified: Secondary | ICD-10-CM | POA: Diagnosis present

## 2019-05-28 DIAGNOSIS — N19 Unspecified kidney failure: Secondary | ICD-10-CM | POA: Diagnosis not present

## 2019-05-28 DIAGNOSIS — R9389 Abnormal findings on diagnostic imaging of other specified body structures: Secondary | ICD-10-CM

## 2019-05-28 DIAGNOSIS — I10 Essential (primary) hypertension: Secondary | ICD-10-CM | POA: Insufficient documentation

## 2019-05-28 DIAGNOSIS — Z9889 Other specified postprocedural states: Secondary | ICD-10-CM | POA: Diagnosis not present

## 2019-05-28 HISTORY — PX: EUS: SHX5427

## 2019-05-28 HISTORY — PX: ESOPHAGOGASTRODUODENOSCOPY (EGD) WITH PROPOFOL: SHX5813

## 2019-05-28 SURGERY — UPPER ENDOSCOPIC ULTRASOUND (EUS) RADIAL
Anesthesia: Monitor Anesthesia Care

## 2019-05-28 MED ORDER — GLYCOPYRROLATE 0.2 MG/ML IJ SOLN
INTRAMUSCULAR | Status: DC | PRN
  Administered 2019-05-28: 0.1 mg via INTRAVENOUS

## 2019-05-28 MED ORDER — PROPOFOL 10 MG/ML IV BOLUS
INTRAVENOUS | Status: AC
  Filled 2019-05-28: qty 60

## 2019-05-28 MED ORDER — EPHEDRINE SULFATE-NACL 50-0.9 MG/10ML-% IV SOSY
PREFILLED_SYRINGE | INTRAVENOUS | Status: DC | PRN
  Administered 2019-05-28 (×3): 10 mg via INTRAVENOUS

## 2019-05-28 MED ORDER — DEXAMETHASONE SODIUM PHOSPHATE 10 MG/ML IJ SOLN
INTRAMUSCULAR | Status: DC | PRN
  Administered 2019-05-28: 5 mg via INTRAVENOUS

## 2019-05-28 MED ORDER — PROPOFOL 10 MG/ML IV BOLUS
INTRAVENOUS | Status: DC | PRN
  Administered 2019-05-28: 120 mg via INTRAVENOUS

## 2019-05-28 MED ORDER — SUCCINYLCHOLINE CHLORIDE 200 MG/10ML IV SOSY
PREFILLED_SYRINGE | INTRAVENOUS | Status: DC | PRN
  Administered 2019-05-28: 100 mg via INTRAVENOUS

## 2019-05-28 MED ORDER — FENTANYL CITRATE (PF) 100 MCG/2ML IJ SOLN
INTRAMUSCULAR | Status: AC
  Filled 2019-05-28: qty 2

## 2019-05-28 MED ORDER — LIDOCAINE HCL 1 % IJ SOLN
INTRAMUSCULAR | Status: DC | PRN
  Administered 2019-05-28: 100 mg via INTRADERMAL

## 2019-05-28 MED ORDER — SODIUM CHLORIDE 0.9 % IV SOLN
INTRAVENOUS | Status: DC
  Administered 2019-05-28: 500 mL via INTRAVENOUS

## 2019-05-28 MED ORDER — ONDANSETRON HCL 4 MG/2ML IJ SOLN
INTRAMUSCULAR | Status: DC | PRN
  Administered 2019-05-28: 4 mg via INTRAVENOUS

## 2019-05-28 MED ORDER — FENTANYL CITRATE (PF) 100 MCG/2ML IJ SOLN
INTRAMUSCULAR | Status: DC | PRN
  Administered 2019-05-28 (×2): 50 ug via INTRAVENOUS

## 2019-05-28 NOTE — Anesthesia Preprocedure Evaluation (Signed)
Anesthesia Evaluation  Patient identified by MRN, date of birth, ID band Patient awake    Reviewed: Allergy & Precautions, H&P , NPO status , Patient's Chart, lab work & pertinent test results  Airway Mallampati: II   Neck ROM: full    Dental   Pulmonary neg pulmonary ROS,    breath sounds clear to auscultation       Cardiovascular hypertension, + dysrhythmias Atrial Fibrillation + Valvular Problems/Murmurs AI  Rhythm:regular Rate:Normal     Neuro/Psych PSYCHIATRIC DISORDERS Dementia    GI/Hepatic   Endo/Other    Renal/GU Renal InsufficiencyRenal disease     Musculoskeletal   Abdominal   Peds  Hematology   Anesthesia Other Findings   Reproductive/Obstetrics                             Anesthesia Physical Anesthesia Plan  ASA: III  Anesthesia Plan: MAC   Post-op Pain Management:    Induction: Intravenous  PONV Risk Score and Plan: 1 and Propofol infusion and Treatment may vary due to age or medical condition  Airway Management Planned: Nasal Cannula  Additional Equipment:   Intra-op Plan:   Post-operative Plan:   Informed Consent: I have reviewed the patients History and Physical, chart, labs and discussed the procedure including the risks, benefits and alternatives for the proposed anesthesia with the patient or authorized representative who has indicated his/her understanding and acceptance.       Plan Discussed with: CRNA, Anesthesiologist and Surgeon  Anesthesia Plan Comments:         Anesthesia Quick Evaluation

## 2019-05-28 NOTE — Op Note (Signed)
[facility] Patient Name: Curtis Mcintosh Procedure Date: 05/28/2019 MRN: [id number] Attending MD: Milus Banister , MD Date of Birth: November 04, 1940 CSN: [id number] Age: 78 Admit Type: Outpatient Procedure:                Upper EUS Indications:              h/o achalasia, recent admit at [facility] for melena,                            dysphagia; still unclear source of his anemia. EGD                            suggested lesion at [employer], food filled                            esophagus. CT suggested mass at GE junction Providers:                Milus Banister, MD, Cleda Daub, RN, Lina Sar, Technician, Cherylynn Ridges, Technician,                            Orma Flaming, CRNA Referring MD:             Milton Ferguson, MD Medicines:                General Anesthesia Complications:            No immediate complications. Estimated blood loss:                            None. Estimated Blood Loss:     Estimated blood loss: none. Procedure:                Pre-Anesthesia Assessment:                           - Prior to the procedure, a History and Physical                            was performed, and patient medications and                            allergies were reviewed. The patient's tolerance of                            previous anesthesia was also reviewed. The risks                            and benefits of the procedure and the sedation                            options and risks were discussed with the patient.  All questions were answered, and informed consent                            was obtained. Prior Anticoagulants: The patient has                            taken no previous anticoagulant or antiplatelet                            agents. ASA Grade Assessment: III - A patient with                            severe systemic disease. After reviewing the risks                            and benefits, the  patient was deemed in                            satisfactory condition to undergo the procedure.                           After obtaining informed consent, the endoscope was                            passed under direct vision. Throughout the                            procedure, the patient's blood pressure, pulse, and                            oxygen saturations were monitored continuously. The                            [id number]-H190 ([id number]) Olympus gastroscope was                            introduced through the mouth, and advanced to the                            antrum of the stomach. The [id number] ([id number])                            Olympus Linear EUS was introduced through the                            mouth, and advanced to the antrum of the stomach.                            The upper EUS was accomplished without difficulty.                            The patient tolerated the procedure well. Scope In: Scope Out: Findings:      ENDOSCOPIC FINDING: :  The esophagus was very dilated and filled with food. I was able to       advance the adult gastroscope into the stomach with gentle pressure,       finesse. There was smooth narrowing at the GE junction typical for       Achalasia. I did not attempt to clear the esophagus.      There was a mucosal mass in the very proximal stomach, looks like       twisted gastric mucosa as seen after fundoplication.      Duodenum was normal.      ENDOSONOGRAPHIC FINDING: :      1. The 'mass' in the proximal stomach on CT and EGD was clearly       postoperative anatomy (fundoplication). There was not a mucosal or       submucosal tumor at the site. Impression:               - Massive, food filled esophagus typical of                            achalasia.                           - The "mass' noted by CT and by EGD in his proximal                            stomach is actually site of surgical fundoplication                             (wrap) that was done at the same time as his Heller                            myotomy in 2012. I was able to view [facility]                            records through [place] and surgical note                            from 2012 stated he underwent a "Lap Heller myotomy                            with fundoplasty and hiatal hernia repair." CT                            report 06/2012 describes a "3.2cm mass in the                            region of the cardia." Notes around that time from                            his surgeon clearly imply that the 'mass' was the                            site of his 2012 wrap.                           -  No further testing for this is necessary. I will                            leave further care for his Achalasia (chronic food                            filled esophagus) to his referring GI. Moderate Sedation:      Not Applicable - Patient had care per Anesthesia. Recommendation:           - Discharge patient to home.                           - Full liquid diet only. Procedure Code(s):        --- Professional ---                           [phone], 39, Esophagogastroduodenoscopy, flexible,                            transoral; with endoscopic ultrasound examination                            limited to the esophagus, stomach or duodenum, and                            adjacent structures Diagnosis Code(s):        --- Professional ---                           R93.3, Abnormal findings on diagnostic imaging of                            other parts of digestive tract CPT copyright 2019 American Medical Association. All rights reserved. The codes documented in this report are preliminary and upon coder review may  be revised to meet current compliance requirements. Milus Banister, MD 05/28/2019 9:54:23 AM This report has been signed electronically. Number of Addenda: 0

## 2019-05-28 NOTE — Transfer of Care (Signed)
Immediate Anesthesia Transfer of Care Note  Patient: Curtis Mcintosh  Procedure(s) Performed: UPPER ENDOSCOPIC ULTRASOUND (EUS) RADIAL (N/A )  Patient Location: PACU and Endoscopy Unit  Anesthesia Type:General  Level of Consciousness: awake, alert , oriented and patient cooperative  Airway & Oxygen Therapy: Patient Spontanous Breathing and Patient connected to face mask oxygen  Post-op Assessment: Report given to RN and Post -op Vital signs reviewed and stable  Post vital signs: Reviewed and stable  Last Vitals:  Vitals Value Taken Time  BP    Temp    Pulse    Resp    SpO2      Last Pain:  Vitals:   05/28/19 0746  TempSrc: Oral  PainSc: 0-No pain         Complications: No apparent anesthesia complications

## 2019-05-28 NOTE — Anesthesia Procedure Notes (Signed)
Procedure Name: Intubation Date/Time: 05/28/2019 9:06 AM Performed by: Garrel Ridgel, CRNA Pre-anesthesia Checklist: Patient identified, Emergency Drugs available, Suction available, Patient being monitored and Timeout performed Patient Re-evaluated:Patient Re-evaluated prior to induction Oxygen Delivery Method: Circle system utilized Preoxygenation: Pre-oxygenation with 100% oxygen Induction Type: IV induction Ventilation: Mask ventilation without difficulty Laryngoscope Size: Mac and 4 Grade View: Grade I Tube size: 7.5 mm Number of attempts: 1 Airway Equipment and Method: Stylet Placement Confirmation: ETT inserted through vocal cords under direct vision Secured at: 22 cm Tube secured with: Tape Dental Injury: Teeth and Oropharynx as per pre-operative assessment

## 2019-05-28 NOTE — H&P (Addendum)
HPI: This is a man who was admitted to [facility] last month for melena, anemia, dysphagia.  Unclear etiology of his melena from what I can tell from reviewing records and d/c Hb in the 7s.  H/o achalasia and a likely mass was noted at the [place] junction  Chief complaint is mass at [place] junction  He is demented.  Doesn't know where he is.  Doesn't recall ever having surgery on his stomach. Doesn't recall EGD last month  ROS: complete GI ROS as described in HPI, all other review negative.  Constitutional:  No unintentional weight loss   Past Medical History:  Diagnosis Date  . A-fib ([place])   . Achalasia   . Alzheimer's dementia ([place])   . Aortic insufficiency   . HTN (hypertension)   . Prostate cancer [facility])    prostate  . Renal disorder    stage 4 kidney failure WILL SEE Jun 04 2019 TO SEE NEPGROLOGIST DR BAVACADA  . Sick sinus syndrome [facility])    pacemaker in remote past    Past Surgical History:  Procedure Laterality Date  . CHOLECYSTECTOMY    . ESOPHAGEAL DILATION N/A 04/16/2019   Procedure: ESOPHAGEAL DILATION;  Surgeon: Daneil Dolin, MD;  Location: [facility] ENDO SUITE;  Service: Endoscopy;  Laterality: N/A;  . ESOPHAGOGASTRODUODENOSCOPY  2011   [facility]: dilated esophagus and tight LES, s/p 25 units Botox, multiple antral polyps s/p biopsy  . ESOPHAGOGASTRODUODENOSCOPY  2016   Last EGD in 2016 at the [facility] with fluid-filled esophagus with dilatation, s/p dilation. Tight at EG junction. polypoid gastritis in antrum  . ESOPHAGOGASTRODUODENOSCOPY (EGD) WITH PROPOFOL N/A 04/16/2019   Procedure: ESOPHAGOGASTRODUODENOSCOPY (EGD) WITH PROPOFOL;  Surgeon: Daneil Dolin, MD;  Location: [facility] SUITE;  Service: Endoscopy;  Laterality: N/A;  . HELLER MYOTOMY  2012   [facility]  . PACEMAKER IMPLANT    . pH and manometry  2012   motility compatible with achalasia or complete aperistalsis, or compatible with s/p Heller myotomy, acidity within esophagus.   Marland Kitchen POLYPECTOMY  04/16/2019   Procedure: POLYPECTOMY;  Surgeon: Daneil Dolin, MD;  Location: [facility] SUITE;  Service: Endoscopy;;  gastric     Current Facility-Administered Medications  Medication Dose Route Frequency Provider Last Rate Last Dose  . 0.9 %  sodium chloride infusion   Intravenous Continuous Milus Banister, MD 20 mL/hr at 05/28/19 0748 500 mL at 05/28/19 0748    Allergies as of 05/01/2019  . (No Known Allergies)    Family History  Problem Relation Age of Onset  . Colon cancer Neg Hx   . Colon polyps Neg Hx     Social History   Socioeconomic History  . Marital status: Divorced    Spouse name: Not on file  . Number of children: Not on file  . Years of education: Not on file  . Highest education level: Not on file  Occupational History  . Not on file  Social Needs  . Financial resource strain: Not on file  . Food insecurity    Worry: Not on file    Inability: Not on file  . Transportation needs    Medical: Not on file    Non-medical: Not on file  Tobacco Use  . Smoking status: Never Smoker  . Smokeless tobacco: Never Used  Substance and Sexual Activity  . Alcohol use: Not Currently    Comment: in the past  . Drug use: Never  . Sexual activity: Not on file  Lifestyle  .  Physical activity    Days per week: Not on file    Minutes per session: Not on file  . Stress: Not on file  Relationships  . Social Herbalist on phone: Not on file    Gets together: Not on file    Attends religious service: Not on file    Active member of club or organization: Not on file    Attends meetings of clubs or organizations: Not on file    Relationship status: Not on file  . Intimate partner violence    Fear of current or ex partner: Not on file    Emotionally abused: Not on file    Physically abused: Not on file    Forced sexual activity: Not on file  Other Topics Concern  . Not on file  Social History Narrative  . Not on file     Physical Exam: BP (!) 169/108   Pulse 70    Temp 97.7 F (36.5 C) (Oral)   Resp 14   Ht 6\' 2"  (1.88 m)   Wt 77.1 kg   SpO2 97%   BMI 21.83 kg/m  Constitutional: generally well-appearing Psychiatric: alert and oriented x3 Abdomen: soft, nontender, nondistended, no obvious ascites, no peritoneal signs, normal bowel sounds No peripheral edema noted in lower extremities  Assessment and plan: 78 y.o. male with mass at GE junction  For upper EUS evaluation. Today. He has dementia (doesn't know where he is, why we are doing this test, doesn't recall ever having surgery on his stomach)  Please see the "Patient Instructions" section for addition details about the plan.  Owens Loffler, MD [facility] 05/28/2019, 8:36 AM

## 2019-05-28 NOTE — Discharge Instructions (Signed)
YOU HAD AN ENDOSCOPIC PROCEDURE TODAY: Refer to the procedure report and other information in the discharge instructions given to you for any specific questions about what was found during the examination. If this information does not answer your questions, please call [place] office at [phone] to clarify.   YOU SHOULD EXPECT: Some feelings of bloating in the abdomen. Passage of more gas than usual. Walking can help get rid of the air that was put into your GI tract during the procedure and reduce the bloating. If you had a lower endoscopy (such as a colonoscopy or flexible sigmoidoscopy) you may notice spotting of blood in your stool or on the toilet paper. Some abdominal soreness may be present for a day or two, also.  DIET: Your first meal following the procedure should be a light meal and then it is ok to progress to your normal diet. A half-sandwich or bowl of soup is an example of a good first meal. Heavy or fried foods are harder to digest and may make you feel nauseous or bloated. Drink plenty of fluids but you should avoid alcoholic beverages for 24 hours. If you had a esophageal dilation, please see attached instructions for diet.    ACTIVITY: Your care partner should take you home directly after the procedure. You should plan to take it easy, moving slowly for the rest of the day. You can resume normal activity the day after the procedure however YOU SHOULD NOT DRIVE, use power tools, machinery or perform tasks that involve climbing or major physical exertion for 24 hours (because of the sedation medicines used during the test).   SYMPTOMS TO REPORT IMMEDIATELY: A gastroenterologist can be reached at any hour. Please call [phone]  for any of the following symptoms:   Following upper endoscopy (EGD, EUS, ERCP, esophageal dilation) Vomiting of blood or coffee ground material  New, significant abdominal pain  New, significant chest pain or pain under the shoulder blades  Painful or  persistently difficult swallowing  New shortness of breath  Black, tarry-looking or red, bloody stools  FOLLOW UP:  If any biopsies were taken you will be contacted by phone or by letter within the next 1-3 weeks. Call [phone]  if you have not heard about the biopsies in 3 weeks.  Please also call with any specific questions about appointments or follow up tests.  

## 2019-05-29 ENCOUNTER — Encounter (HOSPITAL_COMMUNITY): Payer: Self-pay | Admitting: Gastroenterology

## 2019-05-29 NOTE — Anesthesia Postprocedure Evaluation (Signed)
Anesthesia Post Note  Patient: Curtis Mcintosh  Procedure(s) Performed: UPPER ENDOSCOPIC ULTRASOUND (EUS) RADIAL (N/A ) ESOPHAGOGASTRODUODENOSCOPY (EGD) WITH PROPOFOL (N/A )     Patient location during evaluation: PACU Anesthesia Type: MAC Level of consciousness: awake and alert Pain management: pain level controlled Vital Signs Assessment: post-procedure vital signs reviewed and stable Respiratory status: spontaneous breathing, nonlabored ventilation, respiratory function stable and patient connected to nasal cannula oxygen Cardiovascular status: stable and blood pressure returned to baseline Postop Assessment: no apparent nausea or vomiting Anesthetic complications: no    Last Vitals:  Vitals:   05/28/19 1000 05/28/19 1010  BP: (!) 152/84 (!) 153/91  Pulse: 70 68  Resp: 17 15  Temp:    SpO2: 94% 94%    Last Pain:  Vitals:   05/29/19 1052  TempSrc:   PainSc: 0-No pain                 Ellyce Lafevers S

## 2019-06-03 ENCOUNTER — Telehealth: Payer: Self-pay

## 2019-06-03 DIAGNOSIS — K220 Achalasia of cardia: Secondary | ICD-10-CM

## 2019-06-03 NOTE — Telephone Encounter (Signed)
-----   Message from Daneil Dolin, MD sent at 06/03/2019  2:58 PM EDT ----- Recent EUS is help sort out the "mass" at the GE junction.  This is surgical change.  Patient continues to have symptomatic achalasia.  Esophagus is nonfunctional.  I feel that patient needs to go back to where he had the original surgery (?  [facility]) versus [place] to see a GI surgeon who specializes in myotomies and antireflux procedures.  He is not the best surgical candidate.  May benefit from pneumatic dilation of the GE junction.  Again, this would be best done at a tertiary referral center either [facility] or [facility]. Please contact patient/daughter and see which way they would like to proceed. ----- Message ----- From: Milus Banister, MD Sent: 05/28/2019   9:55 AM EDT To: Daneil Dolin, MD  Ronalee Belts, I just completed EGD and EUS. See full report in epic.  Thanks  DJ   - Massive, food filled esophagus typical of achalasia. - The "mass' noted by CT and by EGD in his proximal stomach is actually site of surgical fundoplication (wrap) that was done at the same time as his Heller myotomy in 2012.  I was able to view [facility] records through [place] and surgical note from 2012 stated he underwent a "Lap Heller myotomy with fundoplasty and hiatal hernia repair."  CT report 06/2012 describes a "3.2cm mass in the region of the cardia." Notes around that time from his surgeon clearly imply that the 'mass' was the site of his 2012 wrap. - No further testing for this is necessary. I will leave further care for his Achalasia (chronic food filled esophagus) to his referring GI.

## 2019-06-03 NOTE — Telephone Encounter (Signed)
Tried to call Dr. Florentina Addison office to f/u on referral, LMOVM.

## 2019-06-03 NOTE — Telephone Encounter (Signed)
Tried to call pt's daughter Ailene Ravel), no answer, LMOVM for return call.

## 2019-06-04 NOTE — Telephone Encounter (Signed)
Called daughter [name]), she requests for referral to be sent to [facility]. Pt's insurance will not cover for him to be seen in [place]. Referral faxed to [facility].

## 2019-06-11 ENCOUNTER — Other Ambulatory Visit: Payer: Self-pay | Admitting: *Deleted

## 2019-06-11 NOTE — Telephone Encounter (Signed)
Received faxed that patient cancelled his appt with [facility] on 06/04/2019. FYI to AB

## 2019-11-02 DEATH — deceased

## 2020-07-05 IMAGING — RF UPPER GI SERIES W/HIGH DENSITY WITHOUT KUB
13 of 17 series · 14 of 24 positions shown · non-contrast
Comparison: None.

CLINICAL DATA: Achalasia, dysphagia.

EXAM:
UPPER GI SERIES WITH KUB
TECHNIQUE: After obtaining a scout radiograph a routine upper GI series was
performed using thin and high density barium.
FLUOROSCOPY TIME:  Radiation Exposure Index (if provided by the
fluoroscopic device): 331.1 mGy.

[Series 1: t ap supine · 0.15mm/px · 1 of 1 slices shown]
[im 1/1]
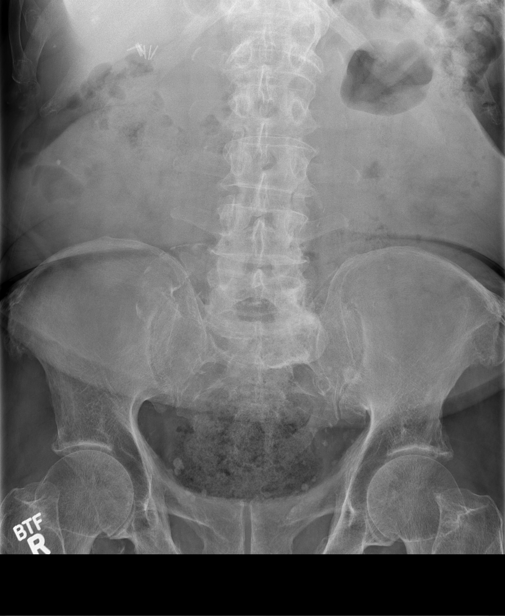

[Series 2: cp_standard · 0.26mm/px · 1 of 290 frames shown (1 of 12)]
[frame 247/290]
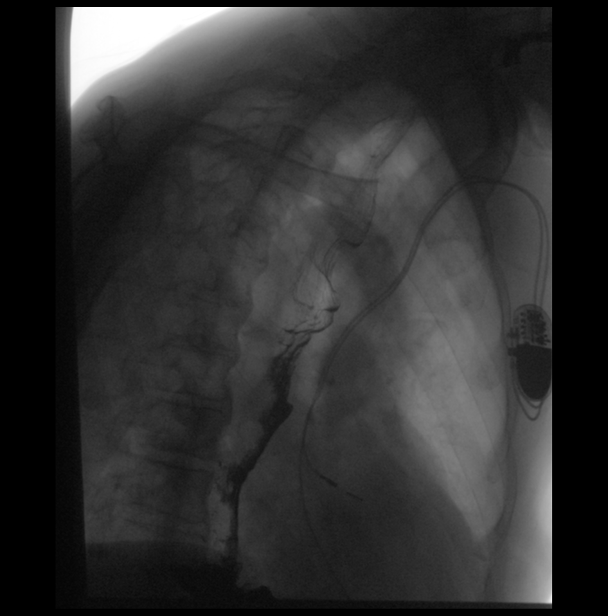

[Series 4: cp_standard · 0.27mm/px · 1 of 172 frames shown (2 of 12)]
[frame 1/172]
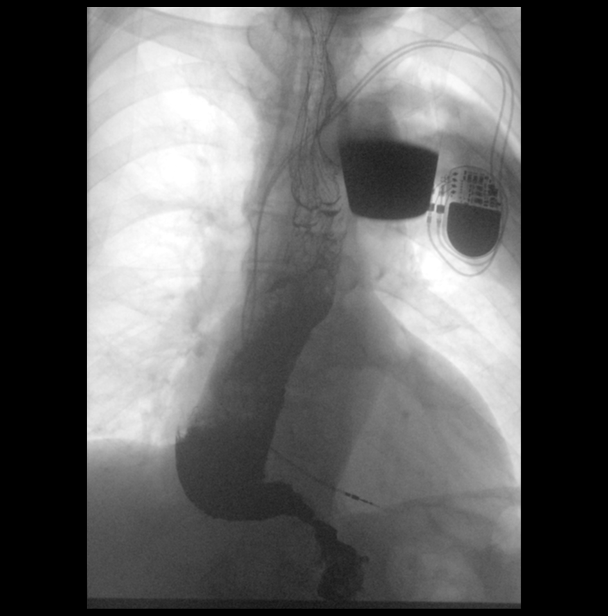

[Series 5: cp_standard · 0.27mm/px · 2 of 196 frames shown (3 of 12)]
[frame 1/196]
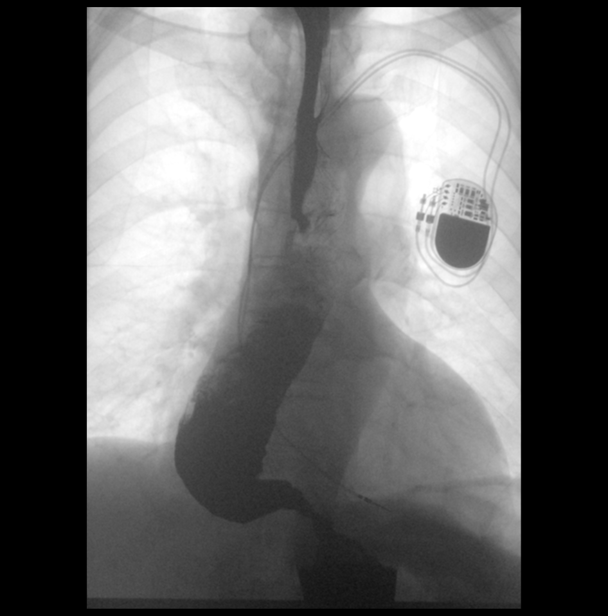
[frame 99/196]
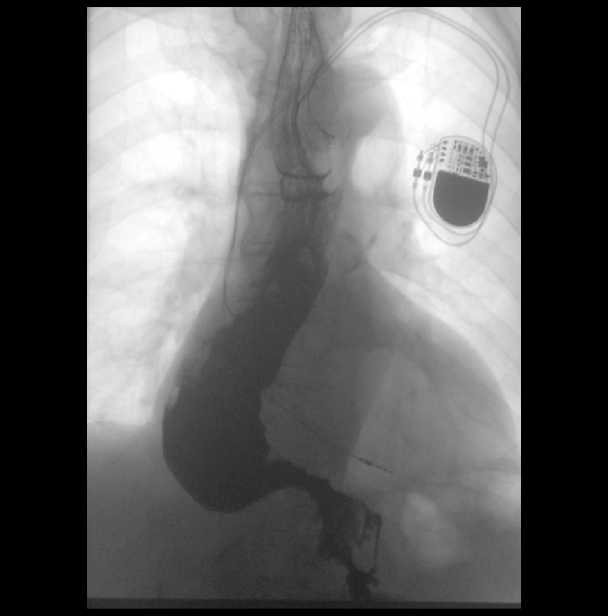

[Series 6: cp_standard · 0.28mm/px · 1 of 230 frames shown (4 of 12)]
[frame 116/230]
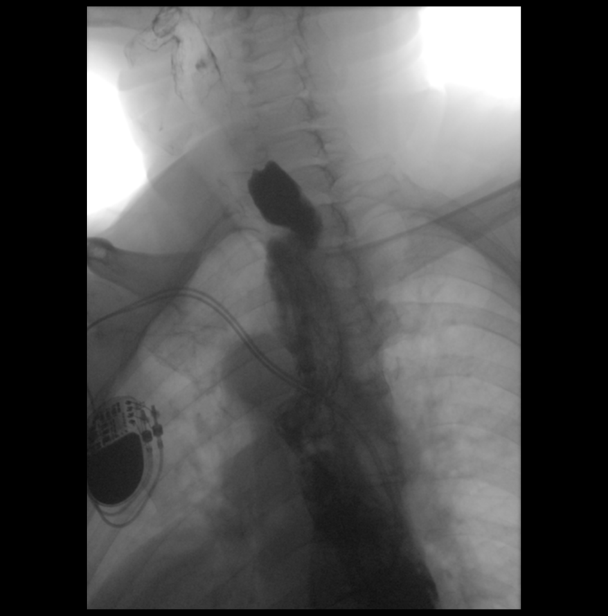

[Series 7: cp_standard · 0.28mm/px · 1 of 164 frames shown (5 of 12)]
[frame 83/164]
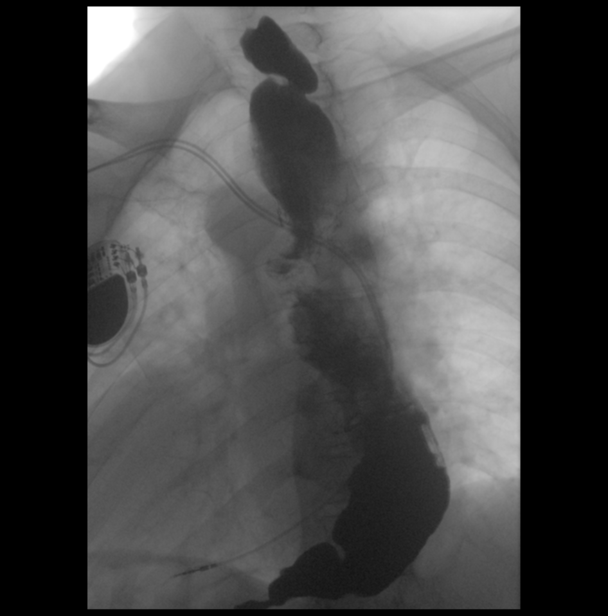

[Series 8: cp_standard · 0.28mm/px · 1 of 391 frames shown (6 of 12)]
[frame 196/391]
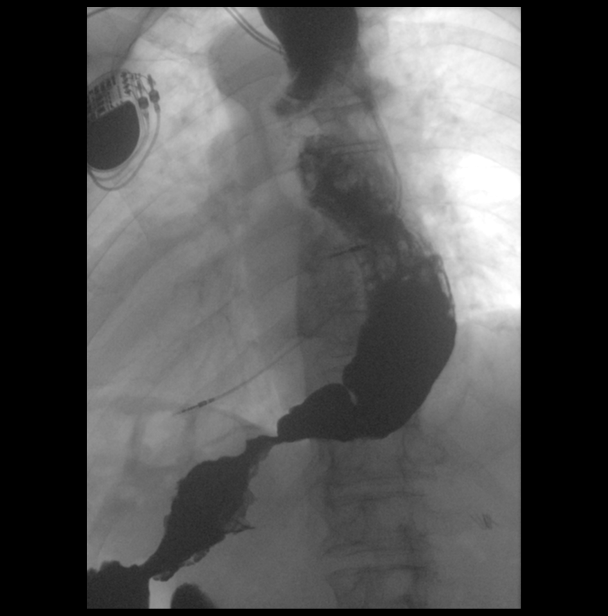

[Series 9: cp_standard · 0.28mm/px · 1 of 195 frames shown (7 of 12)]
[frame 98/195]
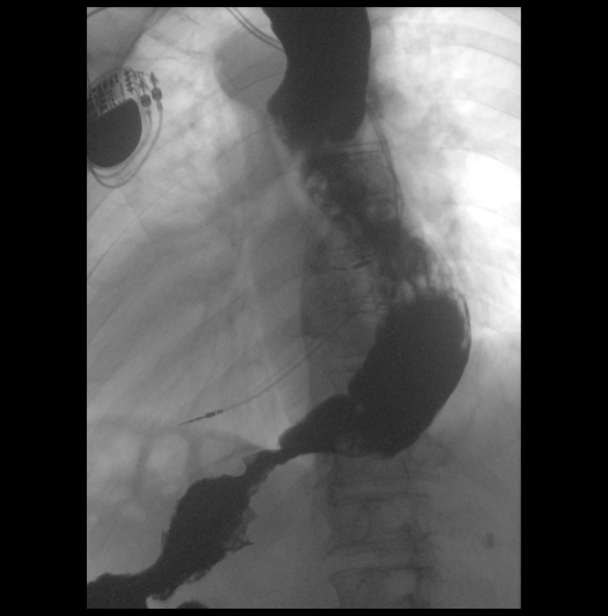

[Series 10: cp_standard · 0.28mm/px · 1 of 230 frames shown (8 of 12)]
[frame 196/230]
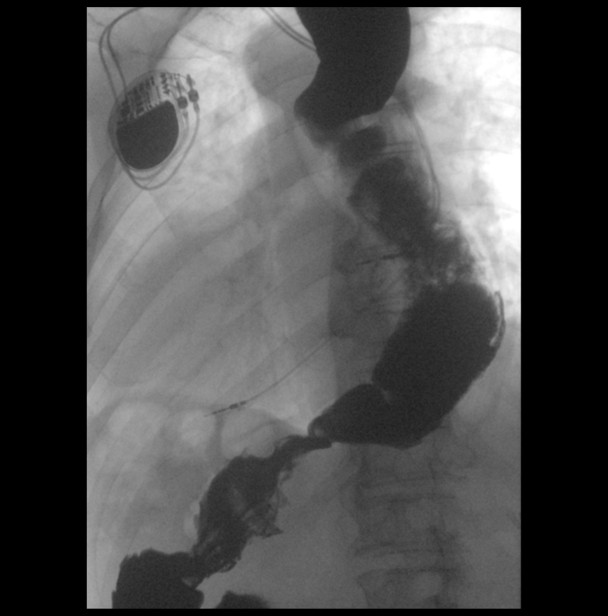

[Series 13: cp_standard · 0.18mm/px · 1 of 1 slices shown (9 of 12)]
[im 1/1]
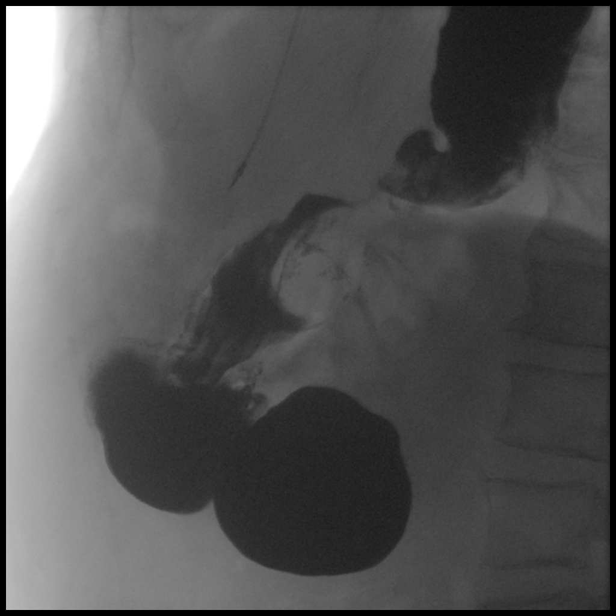

[Series 15: cp_standard · 0.18mm/px · 1 of 1 slices shown (10 of 12)]
[im 1/1]
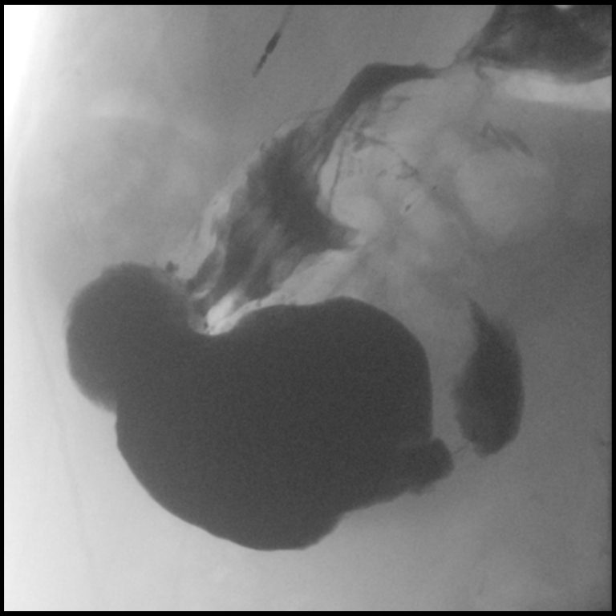

[Series 20: cp_standard · 0.18mm/px · 1 of 1 slices shown (11 of 12)]
[im 1/1]
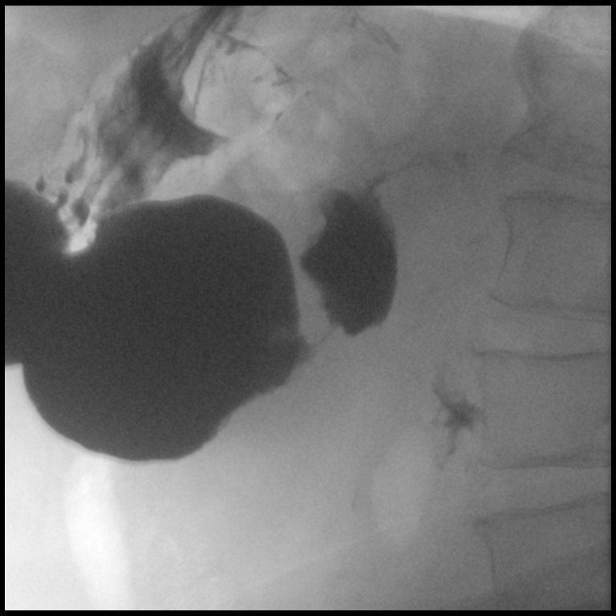

[Series 24: cp_standard · 0.18mm/px · 1 of 1 slices shown (12 of 12)]
[im 1/1]
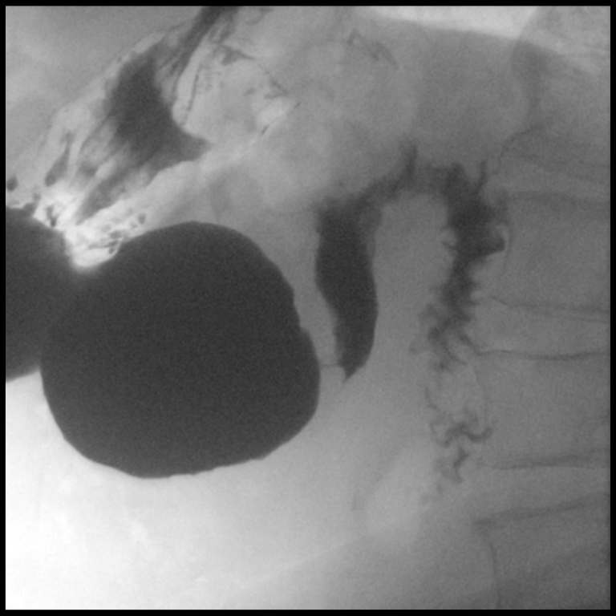

[14 of 24 positions shown; findings below may reference images not displayed]

FINDINGS: Severe narrowing of the gastroesophageal junction is noted
consistent with a history of achalasia. There is dilatation of the
esophagus, particularly involving its distal portion. No definite
mass is noted. However, there does appear to be some retained food
debris within the distal esophagus. A large amount of the
administered barium remained within the esophagus throughout the
exam. Given the fact that there was a significant amount of retained
barium within the distal esophagus, as well as the presence of the
achalasia and food debris, barium tablet was not administered as it
certainly would not have passed into the stomach.

No definite hiatal hernia is noted. Visualization of the stomach was
limited due to the achalasia. Persistent rounded defect is seen
involving the gastric cardia which may represent external
compression, but possible mass cannot be excluded. Contrast flowed
slowly from the stomach into the duodenum. The duodenal bulb and
sweep are unremarkable. Status post cholecystectomy.
IMPRESSION: Severe narrowing of the gastroesophageal junction is noted
consistent with the given history of achalasia. There is significant
dilatation of the esophagus with probable retained food debris seen
in the distal stomach. Large amount of administered contrast
remained within the esophagus throughout the exam, suggesting
significant stenosis.

Persistent rounded filling defect is seen involving the gastric
cardia which may simply represent external compression, but possible
mass cannot be excluded. Endoscopy is recommended for further
evaluation.

## 2020-07-08 IMAGING — DX DG ABDOMEN ACUTE W/ 1V CHEST
3 series · 3 of 3 positions shown · non-contrast
Comparison: Recent abdominal radiographs 04/17/2019

CLINICAL DATA: 78-year-old male with abdominal pain

EXAM:
DG ABDOMEN ACUTE W/ 1V CHEST

[chest pa]
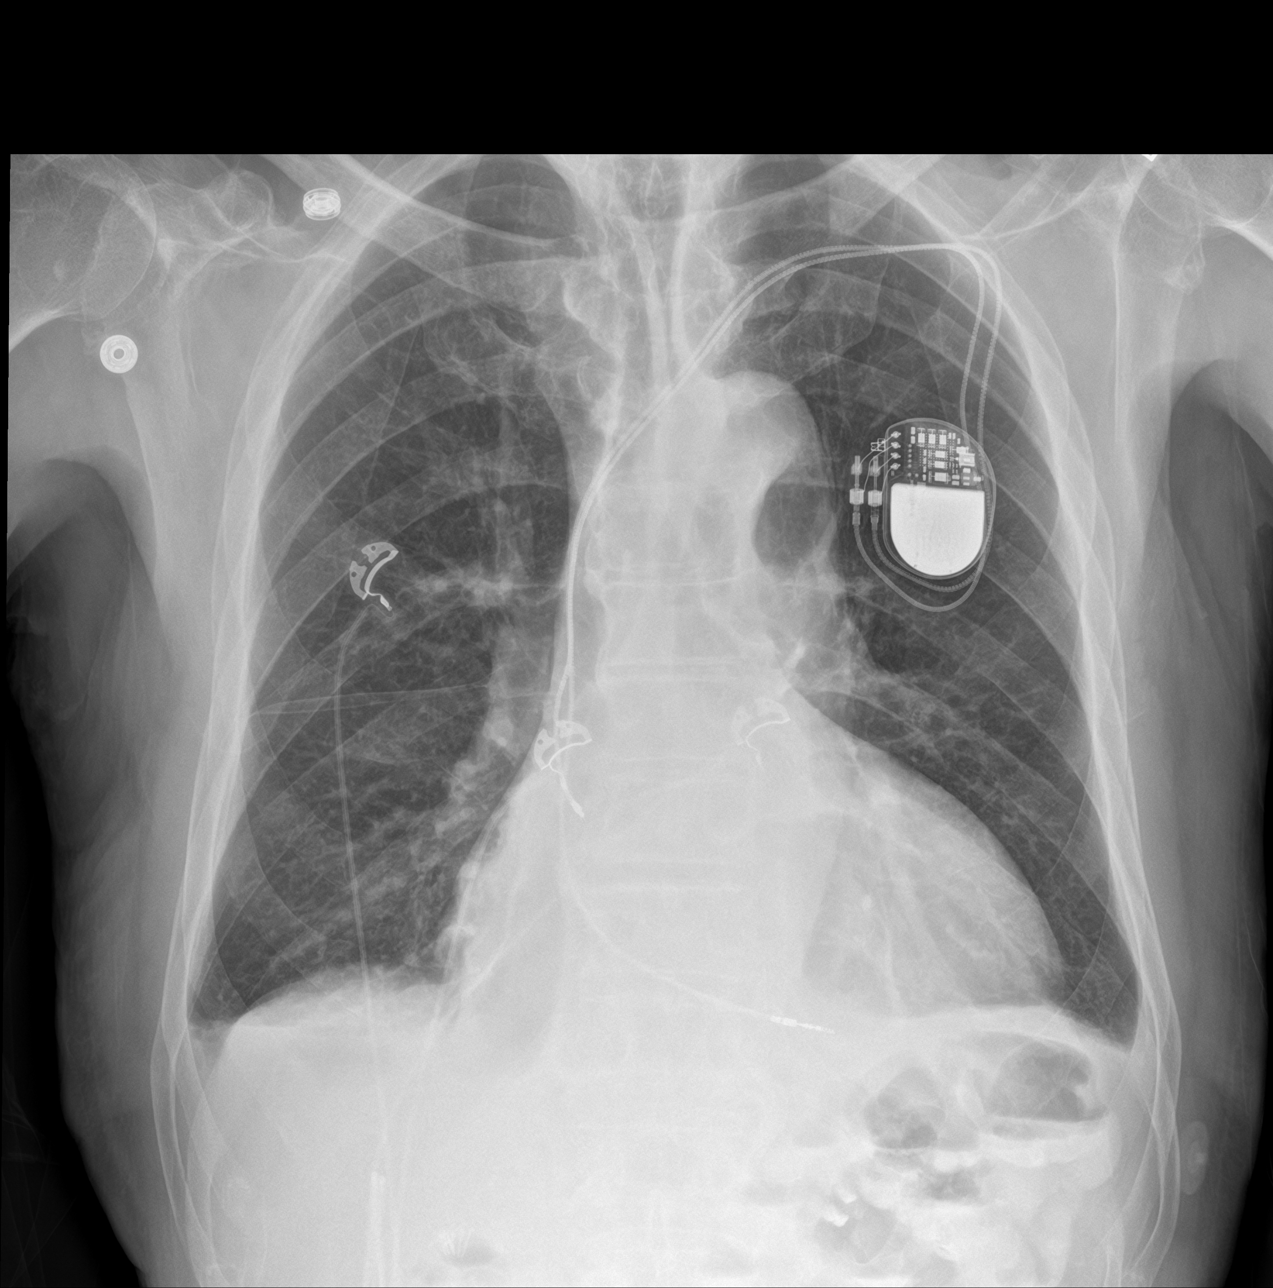

[abdomen erect]
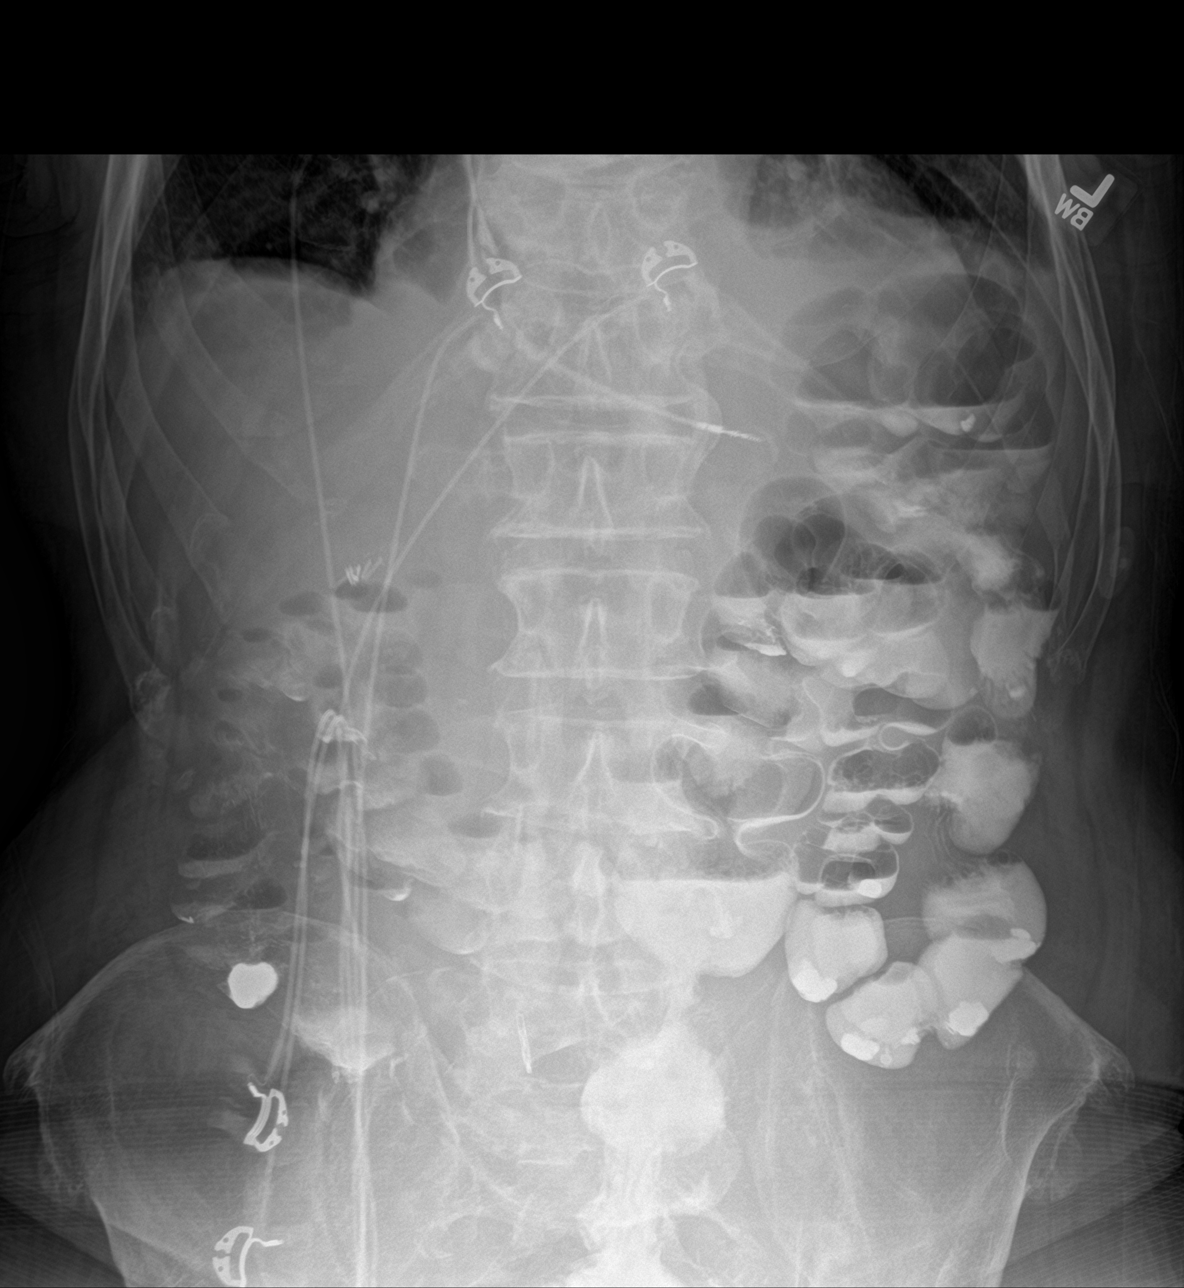

[abdomen supine]
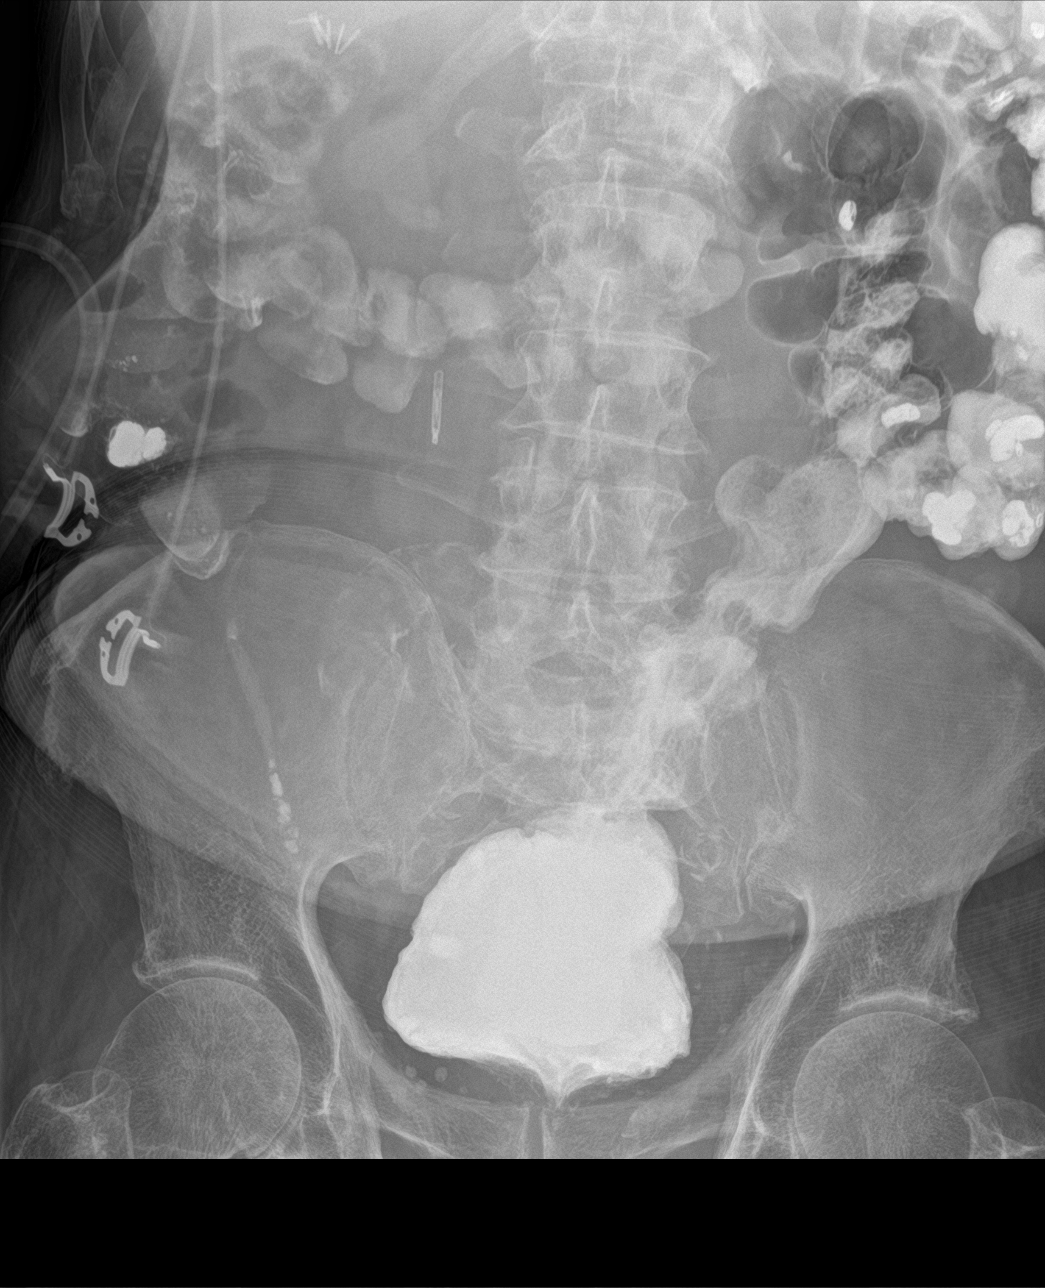

[3 of 3 positions shown; findings below may reference images not displayed]

FINDINGS: Left subclavian approach cardiac rhythm maintenance device. Leads
project over the right atrium and right ventricle. The
cardiopericardial silhouette is enlarged consistent with
cardiomegaly. Atherosclerotic calcifications visualized in the
transverse aorta. The lungs are hyperinflated. No evidence of
pulmonary edema, focal consolidation or pneumothorax. No suspicious
nodule or mass.

Continued progression of previously ingested contrast material
through the colon and into the rectum. Several small colonic
diverticula are noted. Air-fluid levels are present throughout the
colon without associated dilation. Small amount of contrast remains
visible within the appendix. No dilated loops of small bowel.
Surgical clips are present in the right upper quadrant consistent
with prior cholecystectomy. No acute osseous abnormality. No free
air.
IMPRESSION: 1. No acute cardiopulmonary process.
2. Cardiomegaly with cardiac rhythm maintenance device in situ.
3. Pulmonary hyperinflation raises the possibility of underlying
COPD.
4. Progressive movement of oral contrast through the colon and into
the rectum.
5. Multiple small air-fluid levels in the colon without evidence of
distention or obstruction.
6. Scattered colonic diverticula.
7. Normal appendix.
